# Patient Record
Sex: Male | Born: 1940 | Race: White | Hispanic: No | Marital: Single | Smoking: Never smoker
Health system: Southern US, Community
[De-identification: ages and names within clinical notes are randomized; demographics above are authoritative.]

## PROBLEM LIST (undated history)

## (undated) DIAGNOSIS — R569 Unspecified convulsions: Secondary | ICD-10-CM

## (undated) DIAGNOSIS — I639 Cerebral infarction, unspecified: Secondary | ICD-10-CM

## (undated) DIAGNOSIS — E785 Hyperlipidemia, unspecified: Secondary | ICD-10-CM

## (undated) DIAGNOSIS — I2510 Atherosclerotic heart disease of native coronary artery without angina pectoris: Secondary | ICD-10-CM

## (undated) DIAGNOSIS — R4701 Aphasia: Secondary | ICD-10-CM

## (undated) DIAGNOSIS — G8190 Hemiplegia, unspecified affecting unspecified side: Secondary | ICD-10-CM

## (undated) DIAGNOSIS — K219 Gastro-esophageal reflux disease without esophagitis: Secondary | ICD-10-CM

## (undated) HISTORY — DX: Hyperlipidemia, unspecified: E78.5

## (undated) HISTORY — DX: Atherosclerotic heart disease of native coronary artery without angina pectoris: I25.10

## (undated) HISTORY — DX: Aphasia: R47.01

## (undated) HISTORY — DX: Gastro-esophageal reflux disease without esophagitis: K21.9

## (undated) HISTORY — DX: Cerebral infarction, unspecified: I63.9

## (undated) HISTORY — DX: Hemiplegia, unspecified affecting unspecified side: G81.90

## (undated) HISTORY — DX: Unspecified convulsions: R56.9

---

## 1999-10-24 ENCOUNTER — Ambulatory Visit (HOSPITAL_COMMUNITY): Admission: RE | Admit: 1999-10-24 | Discharge: 1999-10-24 | Payer: Self-pay

## 2000-01-30 ENCOUNTER — Ambulatory Visit (HOSPITAL_COMMUNITY): Admission: RE | Admit: 2000-01-30 | Discharge: 2000-01-30 | Payer: Self-pay | Admitting: Gastroenterology

## 2001-05-14 ENCOUNTER — Encounter: Admission: RE | Admit: 2001-05-14 | Discharge: 2001-05-14 | Payer: Self-pay | Admitting: Urology

## 2001-05-14 ENCOUNTER — Encounter: Payer: Self-pay | Admitting: Urology

## 2001-08-24 ENCOUNTER — Other Ambulatory Visit: Admission: RE | Admit: 2001-08-24 | Discharge: 2001-08-24 | Payer: Self-pay | Admitting: Urology

## 2004-10-01 ENCOUNTER — Ambulatory Visit (HOSPITAL_COMMUNITY): Admission: RE | Admit: 2004-10-01 | Discharge: 2004-10-01 | Payer: Self-pay | Admitting: *Deleted

## 2006-01-22 ENCOUNTER — Emergency Department (HOSPITAL_COMMUNITY): Admission: EM | Admit: 2006-01-22 | Discharge: 2006-01-22 | Payer: Self-pay | Admitting: Emergency Medicine

## 2015-05-09 ENCOUNTER — Other Ambulatory Visit: Payer: Self-pay | Admitting: Gastroenterology

## 2017-10-08 ENCOUNTER — Ambulatory Visit
Admission: RE | Admit: 2017-10-08 | Discharge: 2017-10-08 | Disposition: A | Discharge status: Home / Self Care | Payer: Medicare Other | Source: Ambulatory Visit | Attending: Family Medicine | Admitting: Family Medicine

## 2017-10-08 ENCOUNTER — Other Ambulatory Visit: Payer: Self-pay | Admitting: Family Medicine

## 2017-10-08 DIAGNOSIS — I10 Essential (primary) hypertension: Secondary | ICD-10-CM

## 2019-06-29 ENCOUNTER — Other Ambulatory Visit: Payer: Self-pay

## 2019-06-29 ENCOUNTER — Ambulatory Visit (INDEPENDENT_AMBULATORY_CARE_PROVIDER_SITE_OTHER): Payer: Medicare Other | Admitting: Orthopaedic Surgery

## 2019-06-29 ENCOUNTER — Ambulatory Visit: Payer: Self-pay

## 2019-06-29 VITALS — Ht 70.0 in | Wt 185.0 lb

## 2019-06-29 DIAGNOSIS — M25562 Pain in left knee: Secondary | ICD-10-CM | POA: Diagnosis not present

## 2019-06-29 DIAGNOSIS — G8929 Other chronic pain: Secondary | ICD-10-CM | POA: Diagnosis not present

## 2019-06-29 MED ORDER — BUPIVACAINE HCL 0.5 % IJ SOLN
2.0000 mL | INTRAMUSCULAR | Status: AC | PRN
  Administered 2019-06-29: 2 mL via INTRA_ARTICULAR

## 2019-06-29 MED ORDER — METHYLPREDNISOLONE ACETATE 40 MG/ML IJ SUSP
40.0000 mg | INTRAMUSCULAR | Status: AC | PRN
  Administered 2019-06-29: 15:00:00 40 mg via INTRA_ARTICULAR

## 2019-06-29 MED ORDER — LIDOCAINE HCL 1 % IJ SOLN
2.0000 mL | INTRAMUSCULAR | Status: AC | PRN
  Administered 2019-06-29: 15:00:00 2 mL

## 2019-06-29 NOTE — Addendum Note (Signed)
Addended by: Precious Bard on: 06/29/2019 04:16 PM   Modules accepted: Orders

## 2019-06-29 NOTE — Progress Notes (Signed)
Office Visit Note   Patient: Benjamin Rowland           Date of Birth: Jan 28, 1941           MRN: [id number] Visit Date: 06/29/2019              Requested by: Kaleen Mask, MD [address] [place],  [place] [address] PCP: Kaleen Mask, MD   Assessment & Plan: Visit Diagnoses:  1. 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  Aspiration injection performed today.  Patient tolerated this well.  He is to take it easy over the next couple days and then increase activity as tolerated.  We hope that this will give him better relief than over-the-counter medicines.  Fluid was sent for analysis.  Follow-Up Instructions: Return if symptoms worsen or fail to improve.   Orders:  Orders Placed This Encounter  Procedures  . XR Knee Complete 4 Views Left   No orders of the defined types were placed in this encounter.     Procedures: Large Joint Inj: L knee on 06/29/2019 2:57 PM Details: 22 G needle Medications: 2 mL bupivacaine 0.5 %; 2 mL lidocaine 1 %; 40 mg methylPREDNISolone acetate 40 MG/ML Aspirate: 24 mL yellow; sent for lab analysis Outcome: tolerated well, no immediate complications Patient was prepped and draped in the usual sterile fashion.       Clinical Data: No additional findings.   Subjective: Chief Complaint  Patient presents with  . Left Knee - Pain    [name] is a 78 year old gentleman comes in for evaluation of chronic left knee pain for the last 3 to 6 months.  This knee has been bothering him off and on for quite some time.  He states that recently it has become very painful and he cannot sleep at night.  He is taking a lot of BC powder to help with the pain but it only gives him temporary relief.  The pain is causing him to stumble.  He has had a prior left knee aspiration.  Denies any mechanical symptoms or constitutional symptoms.   Review of  Systems  Constitutional: Negative.   All other systems reviewed and are negative.    Objective: Vital Signs: Ht 5\' 10"  (1.778 m)   Wt 185 lb (83.9 kg)   BMI 26.54 kg/m   Physical Exam Vitals signs and nursing note reviewed.  Constitutional:      Appearance: He is well-developed.  HENT:     Head: Normocephalic and atraumatic.  Eyes:     Pupils: Pupils are equal, round, and reactive to light.  Neck:     Musculoskeletal: Neck supple.  Pulmonary:     Effort: Pulmonary effort is normal.  Abdominal:     Palpations: Abdomen is soft.  Musculoskeletal: Normal range of motion.  Skin:    General: Skin is warm.  Neurological:     Mental Status: He is alert and oriented to person, place, and time.  Psychiatric:        Behavior: Behavior normal.        Thought Content: Thought content normal.        Judgment: Judgment normal.     Ortho Exam Left knee exam shows a moderate joint effusion.  Collaterals and cruciates are stable.  Relatively normal range of motion with mild pain. Specialty Comments:  No specialty comments available.  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  There is widespread DJD as well.    PMFS History: There are no active problems to display for this patient.  No past medical history on file.  No family history on file.   Social History   Occupational History  . Not on file  Tobacco Use  . Smoking status: Not on file  Substance and Sexual Activity  . Alcohol use: Not on file  . Drug use: Not on file  . Sexual activity: Not on file

## 2019-06-30 LAB — SYNOVIAL CELL COUNT + DIFF, W/ CRYSTALS
Basophils, %: 0 %
Eosinophils-Synovial: 0 % (ref 0–2)
Lymphocytes-Synovial Fld: 28 % (ref 0–74)
Monocyte/Macrophage: 60 % (ref 0–69)
Neutrophil, Synovial: 12 % (ref 0–24)
Synoviocytes, %: 0 % (ref 0–15)
WBC, Synovial: 380 cells/uL — ABNORMAL HIGH (ref <150)

## 2019-06-30 LAB — TIQ-NTM

## 2019-06-30 NOTE — Progress Notes (Signed)
Fluid consistent with inflammation.

## 2019-09-20 ENCOUNTER — Telehealth: Payer: Self-pay | Admitting: Orthopaedic Surgery

## 2019-09-20 NOTE — Telephone Encounter (Signed)
06/29/2019 labs faxed to [facility] [phone], Larita Fife

## 2019-09-24 ENCOUNTER — Telehealth (HOSPITAL_COMMUNITY): Payer: Self-pay

## 2019-09-24 NOTE — Telephone Encounter (Signed)

## 2019-09-27 ENCOUNTER — Other Ambulatory Visit (HOSPITAL_COMMUNITY): Payer: Self-pay | Admitting: Family Medicine

## 2019-09-27 ENCOUNTER — Ambulatory Visit (HOSPITAL_COMMUNITY)
Admission: RE | Admit: 2019-09-27 | Discharge: 2019-09-27 | Disposition: A | Discharge status: Home / Self Care | Payer: Medicare Other | Source: Ambulatory Visit | Attending: Surgery | Admitting: Surgery

## 2019-09-27 ENCOUNTER — Other Ambulatory Visit: Payer: Self-pay

## 2019-09-27 DIAGNOSIS — M79606 Pain in leg, unspecified: Secondary | ICD-10-CM | POA: Diagnosis present

## 2019-10-05 ENCOUNTER — Other Ambulatory Visit: Payer: Self-pay

## 2019-10-05 ENCOUNTER — Encounter: Payer: Self-pay | Admitting: Orthopaedic Surgery

## 2019-10-05 ENCOUNTER — Ambulatory Visit (INDEPENDENT_AMBULATORY_CARE_PROVIDER_SITE_OTHER): Payer: Medicare Other | Admitting: Orthopaedic Surgery

## 2019-10-05 DIAGNOSIS — G8929 Other chronic pain: Secondary | ICD-10-CM | POA: Diagnosis not present

## 2019-10-05 DIAGNOSIS — M25562 Pain in left knee: Secondary | ICD-10-CM | POA: Diagnosis not present

## 2019-10-05 MED ORDER — METHYLPREDNISOLONE ACETATE 40 MG/ML IJ SUSP
40.0000 mg | INTRAMUSCULAR | Status: AC | PRN
  Administered 2019-10-05: 09:00:00 40 mg via INTRA_ARTICULAR

## 2019-10-05 MED ORDER — LIDOCAINE HCL 1 % IJ SOLN
2.0000 mL | INTRAMUSCULAR | Status: AC | PRN
  Administered 2019-10-05: 09:00:00 2 mL

## 2019-10-05 MED ORDER — BUPIVACAINE HCL 0.5 % IJ SOLN
2.0000 mL | INTRAMUSCULAR | Status: AC | PRN
  Administered 2019-10-05: 09:00:00 2 mL via INTRA_ARTICULAR

## 2019-10-05 NOTE — Progress Notes (Signed)
Office Visit Note   Patient: Benjamin Rowland           Date of Birth: 1941-01-31           MRN: [id number] Visit Date: 10/05/2019              Requested by: Kaleen Mask, MD [address] [place],  [place] [address] PCP: Kaleen Mask, MD   Assessment & Plan: Visit Diagnoses:  1. Chronic pain of left knee     Plan: Impression is recurrent left knee effusion.  I reviewed his x-rays again which shows chondrocalcinosis and the medial femoral condyle OCD defect.  Aspiration injection performed today.  10 cc obtained.   Knee compression sleeve was provided.  We will need to obtain an MRI of the left knee at this point to evaluate for structural abnormalities.  Follow-up after the MRI.  Follow-Up Instructions: Return in about 10 days (around 10/15/2019).   Orders:  Orders Placed This Encounter  Procedures  . Large Joint Inj   No orders of the defined types were placed in this encounter.     Procedures: Large Joint Inj: L knee on 10/05/2019 8:38 AM Details: 22 G needle Medications: 2 mL bupivacaine 0.5 %; 2 mL lidocaine 1 %; 40 mg methylPREDNISolone acetate 40 MG/ML Outcome: tolerated well, no immediate complications Patient was prepped and draped in the usual sterile fashion.       Clinical Data: No additional findings.   Subjective: Chief Complaint  Patient presents with  . Left Knee - Pain    Zaid is following up today for recurrent left knee pain and effusion.  We saw him about 3 months ago and took off about 24 cc of joint fluid and provide him with a cortisone injection.  He states that the cortisone injection did not really help and the fluid has returned.  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  He takes BC powder and Aleve.   Review of Systems  Constitutional: Negative.   All other systems reviewed and are negative.    Objective: Vital Signs: There were no vitals taken for this visit.  Physical Exam Vitals and nursing note  reviewed.  Constitutional:      Appearance: He is well-developed.  Pulmonary:     Effort: Pulmonary effort is normal.  Abdominal:     Palpations: Abdomen is soft.  Skin:    General: Skin is warm.  Neurological:     Mental Status: He is alert and oriented to person, place, and time.  Psychiatric:        Behavior: Behavior normal.        Thought Content: Thought content normal.        Judgment: Judgment normal.     Ortho Exam Left knee exam shows recurrence of the effusion.  No signs of infection or warmth or redness. Specialty Comments:  No specialty comments available.  Imaging: No results found.   PMFS History: Patient Active Problem List   Diagnosis Date Noted  . Chronic pain of left knee 10/05/2019   History reviewed. No pertinent past medical history.  History reviewed. No pertinent family history.  History reviewed. No pertinent surgical history. Social History   Occupational History  . Not on file  Tobacco Use  . Smoking status: Never Smoker  . Smokeless tobacco: Never Used  Substance and Sexual Activity  . Alcohol use: Not on file  . Drug use: Not on file  . Sexual  activity: Not on file

## 2019-10-06 ENCOUNTER — Other Ambulatory Visit: Payer: Self-pay | Admitting: Family Medicine

## 2019-10-06 DIAGNOSIS — M79606 Pain in leg, unspecified: Secondary | ICD-10-CM

## 2019-10-15 ENCOUNTER — Ambulatory Visit (INDEPENDENT_AMBULATORY_CARE_PROVIDER_SITE_OTHER): Payer: Medicare Other | Admitting: Orthopaedic Surgery

## 2019-10-15 ENCOUNTER — Other Ambulatory Visit: Payer: Self-pay

## 2019-10-15 DIAGNOSIS — M25562 Pain in left knee: Secondary | ICD-10-CM

## 2019-10-15 DIAGNOSIS — G8929 Other chronic pain: Secondary | ICD-10-CM

## 2019-10-15 NOTE — Progress Notes (Signed)
Rescheduled until after MRI

## 2019-10-20 ENCOUNTER — Other Ambulatory Visit: Payer: Self-pay

## 2019-10-20 ENCOUNTER — Ambulatory Visit
Admission: RE | Admit: 2019-10-20 | Discharge: 2019-10-20 | Disposition: A | Discharge status: Home / Self Care | Payer: Medicare Other | Source: Ambulatory Visit | Attending: Orthopaedic Surgery | Admitting: Orthopaedic Surgery

## 2019-10-20 DIAGNOSIS — G8929 Other chronic pain: Secondary | ICD-10-CM

## 2019-10-22 NOTE — Progress Notes (Signed)
Needs f/u appt 

## 2019-10-28 ENCOUNTER — Other Ambulatory Visit: Payer: Self-pay

## 2019-10-28 ENCOUNTER — Ambulatory Visit (INDEPENDENT_AMBULATORY_CARE_PROVIDER_SITE_OTHER): Payer: Medicare Other | Admitting: Orthopaedic Surgery

## 2019-10-28 ENCOUNTER — Encounter: Payer: Self-pay | Admitting: Orthopaedic Surgery

## 2019-10-28 VITALS — Ht 69.0 in

## 2019-10-28 DIAGNOSIS — M1712 Unilateral primary osteoarthritis, left knee: Secondary | ICD-10-CM | POA: Diagnosis not present

## 2019-10-28 MED ORDER — MELOXICAM 7.5 MG PO TABS: 7.5000 mg | ORAL_TABLET | Freq: Two times a day (BID) | ORAL | 2 refills | Status: DC | PRN

## 2019-10-28 NOTE — Progress Notes (Signed)
   Office Visit Note   Patient: Benjamin Rowland           Date of Birth: [birth year]           MRN: [id number] Visit Date: 10/28/2019              Requested by: Kaleen Mask, MD [address] [place],  [place] [address] PCP: Kaleen Mask, MD   Assessment & Plan: Visit Diagnoses:  1. Primary osteoarthritis of left knee     Plan: My right knee MRI is consistent with advanced tricompartmental degenerative joint disease.  These findings were reviewed with the patient in detail.  We had a long discussion today about treatment options including knee replacement.  He will take some time to think about this.  In the meantime he will take meloxicam as needed but he will check with his PCP first his creatinine did increase last time he took meloxicam.  Follow-Up Instructions: Return if symptoms worsen or fail to improve.   Orders:  No orders of the defined types were placed in this encounter.  Meds ordered this encounter  Medications  . meloxicam (MOBIC) 7.5 MG tablet    Sig: Take 1 tablet (7.5 mg total) by mouth 2 (two) times daily as needed for pain.    Dispense:  30 tablet    Refill:  2      Procedures: No procedures performed   Clinical Data: No additional findings.   Subjective: Chief Complaint  Patient presents with  . Left Knee - Follow-up    MRI results    Benjamin Rowland returns today for MRI review of the left knee.  He still has significant pain throughout the day and severe difficulty with ADLs.  Cortisone injections, over-the-counter NSAIDs, Voltaren gel have not provided much relief.  He ambulates with a cane occasionally.   Review of Systems  Constitutional: Negative.   All other systems reviewed and are negative.    Objective: Vital Signs: Ht 5\' 9"  (1.753 m)   BMI 27.32 kg/m   Physical Exam Vitals and nursing note reviewed.  Constitutional:      Appearance: He is well-developed.  Pulmonary:     Effort: Pulmonary effort is normal.    Abdominal:     Palpations: Abdomen is soft.  Skin:    General: Skin is warm.  Neurological:     Mental Status: He is alert and oriented to person, place, and time.  Psychiatric:        Behavior: Behavior normal.        Thought Content: Thought content normal.        Judgment: Judgment normal.     Ortho Exam Left knee exam is unchanged. Specialty Comments:  No specialty comments available.  Imaging: No results found.   PMFS History: Patient Active Problem List   Diagnosis Date Noted  . Primary osteoarthritis of left knee 10/28/2019  . Chronic pain of left knee 10/05/2019   History reviewed. No pertinent past medical history.  History reviewed. No pertinent family history.  History reviewed. No pertinent surgical history. Social History   Occupational History  . Not on file  Tobacco Use  . Smoking status: Never Smoker  . Smokeless tobacco: Never Used  Substance and Sexual Activity  . Alcohol use: Not on file  . Drug use: Not on file  . Sexual activity: Not on file

## 2020-01-10 ENCOUNTER — Other Ambulatory Visit: Payer: Self-pay | Admitting: Family Medicine

## 2020-01-10 ENCOUNTER — Ambulatory Visit
Admission: RE | Admit: 2020-01-10 | Discharge: 2020-01-10 | Disposition: A | Discharge status: Home / Self Care | Payer: Medicare Other | Source: Ambulatory Visit | Attending: Family Medicine | Admitting: Family Medicine

## 2020-01-10 DIAGNOSIS — M25551 Pain in right hip: Secondary | ICD-10-CM

## 2020-01-10 DIAGNOSIS — M25552 Pain in left hip: Secondary | ICD-10-CM

## 2020-01-20 ENCOUNTER — Other Ambulatory Visit: Payer: Self-pay | Admitting: Family Medicine

## 2020-01-20 DIAGNOSIS — K5909 Other constipation: Secondary | ICD-10-CM

## 2020-01-20 DIAGNOSIS — M25559 Pain in unspecified hip: Secondary | ICD-10-CM

## 2020-01-24 ENCOUNTER — Ambulatory Visit
Admission: RE | Admit: 2020-01-24 | Discharge: 2020-01-24 | Disposition: A | Discharge status: Home / Self Care | Payer: Medicare Other | Source: Ambulatory Visit | Attending: Family Medicine | Admitting: Family Medicine

## 2020-01-24 DIAGNOSIS — K5909 Other constipation: Secondary | ICD-10-CM

## 2020-01-24 DIAGNOSIS — M25559 Pain in unspecified hip: Secondary | ICD-10-CM

## 2020-01-24 MED ORDER — IOPAMIDOL (ISOVUE-300) INJECTION 61%
100.0000 mL | Freq: Once | INTRAVENOUS | Status: AC | PRN
  Administered 2020-01-24: 09:00:00 100 mL via INTRAVENOUS

## 2021-03-05 ENCOUNTER — Emergency Department (HOSPITAL_COMMUNITY): Payer: Medicare Other

## 2021-03-05 ENCOUNTER — Inpatient Hospital Stay (HOSPITAL_COMMUNITY)
Admission: EM | Admit: 2021-03-05 | Discharge: 2021-03-08 | DRG: 040 | Disposition: A | Discharge status: Skilled Nursing Facility | Payer: Medicare Other | Source: Other Acute Inpatient Hospital | Attending: Neurology | Admitting: Neurology

## 2021-03-05 ENCOUNTER — Inpatient Hospital Stay (HOSPITAL_COMMUNITY): Payer: Medicare Other

## 2021-03-05 DIAGNOSIS — I480 Paroxysmal atrial fibrillation: Secondary | ICD-10-CM | POA: Diagnosis present

## 2021-03-05 DIAGNOSIS — I63512 Cerebral infarction due to unspecified occlusion or stenosis of left middle cerebral artery: Secondary | ICD-10-CM | POA: Diagnosis present

## 2021-03-05 DIAGNOSIS — I10 Essential (primary) hypertension: Secondary | ICD-10-CM | POA: Diagnosis present

## 2021-03-05 DIAGNOSIS — E785 Hyperlipidemia, unspecified: Secondary | ICD-10-CM | POA: Diagnosis present

## 2021-03-05 DIAGNOSIS — I69351 Hemiplegia and hemiparesis following cerebral infarction affecting right dominant side: Secondary | ICD-10-CM

## 2021-03-05 DIAGNOSIS — G8929 Other chronic pain: Secondary | ICD-10-CM | POA: Diagnosis present

## 2021-03-05 DIAGNOSIS — R29707 NIHSS score 7: Secondary | ICD-10-CM | POA: Diagnosis present

## 2021-03-05 DIAGNOSIS — Z981 Arthrodesis status: Secondary | ICD-10-CM

## 2021-03-05 DIAGNOSIS — Z20822 Contact with and (suspected) exposure to covid-19: Secondary | ICD-10-CM | POA: Diagnosis present

## 2021-03-05 DIAGNOSIS — R001 Bradycardia, unspecified: Secondary | ICD-10-CM | POA: Diagnosis not present

## 2021-03-05 DIAGNOSIS — R471 Dysarthria and anarthria: Secondary | ICD-10-CM | POA: Diagnosis present

## 2021-03-05 DIAGNOSIS — I6300 Cerebral infarction due to thrombosis of unspecified precerebral artery: Secondary | ICD-10-CM | POA: Diagnosis not present

## 2021-03-05 DIAGNOSIS — I639 Cerebral infarction, unspecified: Secondary | ICD-10-CM

## 2021-03-05 DIAGNOSIS — R4701 Aphasia: Secondary | ICD-10-CM | POA: Diagnosis present

## 2021-03-05 DIAGNOSIS — M1712 Unilateral primary osteoarthritis, left knee: Secondary | ICD-10-CM | POA: Diagnosis present

## 2021-03-05 DIAGNOSIS — R29810 Facial weakness: Secondary | ICD-10-CM | POA: Diagnosis present

## 2021-03-05 DIAGNOSIS — M47812 Spondylosis without myelopathy or radiculopathy, cervical region: Secondary | ICD-10-CM | POA: Diagnosis present

## 2021-03-05 DIAGNOSIS — G936 Cerebral edema: Secondary | ICD-10-CM | POA: Diagnosis present

## 2021-03-05 DIAGNOSIS — I6389 Other cerebral infarction: Secondary | ICD-10-CM | POA: Diagnosis not present

## 2021-03-05 DIAGNOSIS — R270 Ataxia, unspecified: Secondary | ICD-10-CM | POA: Diagnosis present

## 2021-03-05 DIAGNOSIS — I672 Cerebral atherosclerosis: Secondary | ICD-10-CM | POA: Diagnosis present

## 2021-03-05 DIAGNOSIS — G9389 Other specified disorders of brain: Secondary | ICD-10-CM | POA: Diagnosis not present

## 2021-03-05 LAB — URINALYSIS, ROUTINE W REFLEX MICROSCOPIC
Bilirubin Urine: NEGATIVE
Glucose, UA: NEGATIVE mg/dL
Hgb urine dipstick: NEGATIVE
Ketones, ur: NEGATIVE mg/dL
Leukocytes,Ua: NEGATIVE
Nitrite: NEGATIVE
Protein, ur: NEGATIVE mg/dL
Specific Gravity, Urine: 1.021 (ref 1.005–1.030)
pH: 6 (ref 5.0–8.0)

## 2021-03-05 LAB — DIFFERENTIAL
Abs Immature Granulocytes: 0.02 10*3/uL (ref 0.00–0.07)
Basophils Absolute: 0 10*3/uL (ref 0.0–0.1)
Basophils Relative: 1 %
Eosinophils Absolute: 0.2 10*3/uL (ref 0.0–0.5)
Eosinophils Relative: 3 %
Immature Granulocytes: 0 %
Lymphocytes Relative: 34 %
Lymphs Abs: 2.6 10*3/uL (ref 0.7–4.0)
Monocytes Absolute: 0.8 10*3/uL (ref 0.1–1.0)
Monocytes Relative: 11 %
Neutro Abs: 4 10*3/uL (ref 1.7–7.7)
Neutrophils Relative %: 51 %

## 2021-03-05 LAB — CBC
HCT: 47 % (ref 39.0–52.0)
Hemoglobin: 15.9 g/dL (ref 13.0–17.0)
MCH: 31.2 pg (ref 26.0–34.0)
MCHC: 33.8 g/dL (ref 30.0–36.0)
MCV: 92.3 fL (ref 80.0–100.0)
Platelets: 246 10*3/uL (ref 150–400)
RBC: 5.09 MIL/uL (ref 4.22–5.81)
RDW: 13.7 % (ref 11.5–15.5)
WBC: 7.6 10*3/uL (ref 4.0–10.5)
nRBC: 0 % (ref 0.0–0.2)

## 2021-03-05 LAB — COMPREHENSIVE METABOLIC PANEL
ALT: 17 U/L (ref 0–44)
AST: 29 U/L (ref 15–41)
Albumin: 3.8 g/dL (ref 3.5–5.0)
Alkaline Phosphatase: 82 U/L (ref 38–126)
Anion gap: 7 (ref 5–15)
BUN: 18 mg/dL (ref 8–23)
CO2: 25 mmol/L (ref 22–32)
Calcium: 9.3 mg/dL (ref 8.9–10.3)
Chloride: 106 mmol/L (ref 98–111)
Creatinine, Ser: 1.3 mg/dL — ABNORMAL HIGH (ref 0.61–1.24)
GFR, Estimated: 56 mL/min — ABNORMAL LOW (ref >60)
Glucose, Bld: 117 mg/dL — ABNORMAL HIGH (ref 70–99)
Potassium: 3.9 mmol/L (ref 3.5–5.1)
Sodium: 138 mmol/L (ref 135–145)
Total Bilirubin: 0.7 mg/dL (ref 0.3–1.2)
Total Protein: 6.5 g/dL (ref 6.5–8.1)

## 2021-03-05 LAB — I-STAT CHEM 8, ED
BUN: 24 mg/dL — ABNORMAL HIGH (ref 8–23)
Calcium, Ion: 1.09 mmol/L — ABNORMAL LOW (ref 1.15–1.40)
Chloride: 104 mmol/L (ref 98–111)
Creatinine, Ser: 1.3 mg/dL — ABNORMAL HIGH (ref 0.61–1.24)
Glucose, Bld: 113 mg/dL — ABNORMAL HIGH (ref 70–99)
HCT: 44 % (ref 39.0–52.0)
Hemoglobin: 15 g/dL (ref 13.0–17.0)
Potassium: 3.9 mmol/L (ref 3.5–5.1)
Sodium: 139 mmol/L (ref 135–145)
TCO2: 25 mmol/L (ref 22–32)

## 2021-03-05 LAB — ETHANOL: Alcohol, Ethyl (B): <10 mg/dL (ref <10)

## 2021-03-05 LAB — PROTIME-INR
INR: 1 (ref 0.8–1.2)
Prothrombin Time: 13.6 seconds (ref 11.4–15.2)

## 2021-03-05 LAB — RAPID URINE DRUG SCREEN, HOSP PERFORMED
Amphetamines: NOT DETECTED
Barbiturates: NOT DETECTED
Benzodiazepines: NOT DETECTED
Cocaine: NOT DETECTED
Opiates: NOT DETECTED
Tetrahydrocannabinol: NOT DETECTED

## 2021-03-05 LAB — CBG MONITORING, ED: Glucose-Capillary: 117 mg/dL — ABNORMAL HIGH (ref 70–99)

## 2021-03-05 LAB — RESP PANEL BY RT-PCR (FLU A&B, COVID) ARPGX2
Influenza A by PCR: NEGATIVE
Influenza B by PCR: NEGATIVE
SARS Coronavirus 2 by RT PCR: NEGATIVE

## 2021-03-05 LAB — APTT: aPTT: 31 seconds (ref 24–36)

## 2021-03-05 MED ORDER — ALTEPLASE (STROKE) FULL DOSE INFUSION
0.9000 mg/kg | Freq: Once | INTRAVENOUS | Status: DC
  Filled 2021-03-05: qty 100

## 2021-03-05 MED ORDER — ACETAMINOPHEN 325 MG PO TABS
650.0000 mg | ORAL_TABLET | ORAL | Status: DC | PRN
  Administered 2021-03-06: 650 mg via ORAL
  Filled 2021-03-05: qty 2

## 2021-03-05 MED ORDER — SENNOSIDES-DOCUSATE SODIUM 8.6-50 MG PO TABS
1.0000 | ORAL_TABLET | Freq: Every evening | ORAL | Status: DC | PRN
  Administered 2021-03-08: 1 via ORAL
  Filled 2021-03-05: qty 1

## 2021-03-05 MED ORDER — IOHEXOL 350 MG/ML SOLN
40.0000 mL | Freq: Once | INTRAVENOUS | Status: AC | PRN
  Administered 2021-03-05: 40 mL via INTRAVENOUS

## 2021-03-05 MED ORDER — IOHEXOL 350 MG/ML SOLN
75.0000 mL | Freq: Once | INTRAVENOUS | Status: AC | PRN
  Administered 2021-03-05: 75 mL via INTRAVENOUS

## 2021-03-05 MED ORDER — CLEVIDIPINE BUTYRATE 0.5 MG/ML IV EMUL
0.0000 mg/h | INTRAVENOUS | Status: DC
  Filled 2021-03-05: qty 50

## 2021-03-05 MED ORDER — CLEVIDIPINE BUTYRATE 0.5 MG/ML IV EMUL
INTRAVENOUS | Status: AC
  Administered 2021-03-05: 1 mg/h via INTRAVENOUS
  Filled 2021-03-05: qty 50

## 2021-03-05 MED ORDER — SODIUM CHLORIDE 0.9 % IV SOLN
250.0000 mg | Freq: Once | INTRAVENOUS | Status: AC
  Administered 2021-03-05: 250 mg via INTRAVENOUS
  Filled 2021-03-05: qty 2

## 2021-03-05 MED ORDER — STROKE: EARLY STAGES OF RECOVERY BOOK
Freq: Once | Status: AC
  Filled 2021-03-05 (×2): qty 1

## 2021-03-05 MED ORDER — DIPHENHYDRAMINE HCL 50 MG/ML IJ SOLN
25.0000 mg | Freq: Once | INTRAMUSCULAR | Status: AC
  Administered 2021-03-05: 25 mg via INTRAVENOUS
  Filled 2021-03-05: qty 1

## 2021-03-05 MED ORDER — SODIUM CHLORIDE 0.9 % IV SOLN: INTRAVENOUS | Status: AC

## 2021-03-05 MED ORDER — HYDRALAZINE HCL 20 MG/ML IJ SOLN: 20.0000 mg | Freq: Once | INTRAMUSCULAR | Status: AC

## 2021-03-05 MED ORDER — SODIUM CHLORIDE 0.9 % IV SOLN: 50.0000 mL | Freq: Once | INTRAVENOUS | Status: DC

## 2021-03-05 MED ORDER — HYDRALAZINE HCL 20 MG/ML IJ SOLN
INTRAMUSCULAR | Status: AC
  Administered 2021-03-05: 20 mg via INTRAVENOUS
  Filled 2021-03-05: qty 1

## 2021-03-05 MED ORDER — ACETAMINOPHEN 160 MG/5ML PO SOLN: 650.0000 mg | ORAL | Status: DC | PRN

## 2021-03-05 MED ORDER — PANTOPRAZOLE SODIUM 40 MG IV SOLR
40.0000 mg | Freq: Every day | INTRAVENOUS | Status: DC
  Administered 2021-03-05 – 2021-03-06 (×2): 40 mg via INTRAVENOUS
  Filled 2021-03-05 (×2): qty 40

## 2021-03-05 MED ORDER — CHLORHEXIDINE GLUCONATE CLOTH 2 % EX PADS
6.0000 | MEDICATED_PAD | Freq: Every day | CUTANEOUS | Status: DC
  Administered 2021-03-08: 6 via TOPICAL

## 2021-03-05 MED ORDER — CLEVIDIPINE BUTYRATE 0.5 MG/ML IV EMUL
0.0000 mg/h | INTRAVENOUS | Status: DC
  Administered 2021-03-05: 1 mg/h via INTRAVENOUS
  Filled 2021-03-05: qty 50

## 2021-03-05 MED ORDER — ORAL CARE MOUTH RINSE
15.0000 mL | Freq: Two times a day (BID) | OROMUCOSAL | Status: DC
  Administered 2021-03-05 – 2021-03-08 (×6): 15 mL via OROMUCOSAL

## 2021-03-05 MED ORDER — FAMOTIDINE IN NACL 20-0.9 MG/50ML-% IV SOLN
20.0000 mg | Freq: Once | INTRAVENOUS | Status: AC
  Administered 2021-03-05: 20 mg via INTRAVENOUS
  Filled 2021-03-05: qty 50

## 2021-03-05 MED ORDER — ALTEPLASE (STROKE) FULL DOSE INFUSION
0.9000 mg/kg | Freq: Once | INTRAVENOUS | Status: DC
  Administered 2021-03-05: 79.4 mg via INTRAVENOUS
  Filled 2021-03-05: qty 100

## 2021-03-05 MED ORDER — ACETAMINOPHEN 650 MG RE SUPP: 650.0000 mg | RECTAL | Status: DC | PRN

## 2021-03-05 NOTE — Progress Notes (Signed)
Pharmacist Code Stroke Response  Notified to mix tPA at 1205 by Dr. Derry Lory Delivered tPA to RN at 1220  tPA dose = 7.9 mg bolus over 1 minute followed by 71.5mg  for a total dose of 79.4 mg over 1 hour  Issues/delays encountered (if applicable): None  Tera Mater 03/05/21 12:37 PM

## 2021-03-05 NOTE — ED Provider Notes (Signed)
[facility] EMERGENCY DEPARTMENT Provider Note   CSN: [id number] Arrival date & time: 03/05/21  1200     History No chief complaint on file.   Benjamin Rowland is a 80 y.o. male.  HPI Last seen normal 10 AM.  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  He was aware of a sudden onset of right-sided weakness and a foggy feeling in his head.  By the time EMS arrived, patient's symptoms had resolved.  During transport, symptoms rebounded prior to arrival.  Patient's first call had been for right arm weakness and leg numbness.  Patient reports this morning he was completely normal.  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  Now, he reports he has a foggy feeling in his head.  He reports his right arm is weak.  Never similar symptoms.  Patient is not on any blood thinner medications.  No trauma.    No past medical history on file.  Patient Active Problem List   Diagnosis Date Noted   Primary osteoarthritis of left knee 10/28/2019   Chronic pain of left knee 10/05/2019    No past surgical history on file.     No family history on file.  Social History   Tobacco Use   Smoking status: Never   Smokeless tobacco: Never    Home Medications Prior to Admission medications   Medication Sig Start Date End Date Taking? Authorizing Provider  meloxicam (MOBIC) 7.5 MG tablet Take 1 tablet (7.5 mg total) by mouth 2 (two) times daily as needed for pain. 10/28/19   Tarry Kos, MD    Allergies    Patient has no known allergies.  Review of Systems   Review of Systems 10 systems reviewed and negative except as per HPI Physical Exam Updated Vital Signs There were no vitals taken for this visit.  Physical Exam Constitutional:      Comments: Awake and alert.  Patient seems slightly drowsy but is following commands.  HENT:     Head: Normocephalic and atraumatic.     Nose: Nose normal.     Mouth/Throat:     Pharynx: Oropharynx is clear.   Eyes:     Extraocular Movements: Extraocular movements intact.     Pupils: Pupils are equal, round, and reactive to light.  Cardiovascular:     Rate and Rhythm: Normal rate and regular rhythm.  Pulmonary:     Effort: Pulmonary effort is normal.     Breath sounds: Normal breath sounds.  Abdominal:     General: There is no distension.     Palpations: Abdomen is soft.     Tenderness: There is no abdominal tenderness. There is no guarding.  Musculoskeletal:        General: No swelling or tenderness.     Cervical back: Neck supple.  Skin:    General: Skin is warm and dry.  Neurological:     Comments: Patient is just mildly somnolent.  He is following commands.  Patient has left pronator drift.  Grip strength on the left is 2\5, grip strength right 5\5.  Patient cannot perform finger-nose on the right.  He tries to raise his hand to his face but cannot touch his nose.  Normal finger-nose on the left.  Patient can elevate his right lower extremity off of the bed and hold with some weakness present to downward pressure.  Patient can hold the left lower extremity without difficulty and resist downward pressure.  Patient can accurately identify light touch left and right.  He is not extinguishing.  And can identify normal objects correctly.  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    ED Results / Procedures / Treatments   Labs (all labs ordered are listed, but only abnormal results are displayed) Labs Reviewed  CBG MONITORING, ED - Abnormal; Notable for the following components:      Result Value   Glucose-Capillary 117 (*)    All other components within normal limits  RESP PANEL BY RT-PCR (FLU A&B, COVID) ARPGX2  ETHANOL  PROTIME-INR  APTT  CBC  DIFFERENTIAL  COMPREHENSIVE METABOLIC PANEL  RAPID URINE DRUG SCREEN, HOSP PERFORMED  URINALYSIS, ROUTINE W REFLEX MICROSCOPIC  I-STAT CHEM 8, ED    EKG EKG  Interpretation  Date/Time:  Monday March 05 2021 12:43:03 EDT Ventricular Rate:  98 PR Interval:  233 QRS Duration: 136 QT Interval:  363 QTC Calculation: 464 R Axis:   -72 Text Interpretation: Sinus rhythm Atrial premature complex Prolonged PR interval Left bundle branch block oldLBBB, rate increased otherwise no change Confirmed by Arby Barrette [phone]) on 03/05/2021 12:51:07 PM Also confirmed by Arby Barrette [id number]), editor [place], LaVerne ([id number])  on 03/06/2021 7:51:58 AM  Radiology No results found.  Procedures Procedures  CRITICAL CARE Performed by: Arby Barrette   Total critical care time: 30 minutes  Critical care time was exclusive of separately billable procedures and treating other patients.  Critical care was necessary to treat or prevent imminent or life-threatening deterioration.  Critical care was time spent personally by me on the following activities: development of treatment plan with patient and/or surrogate as well as nursing, discussions with consultants, evaluation of patient's response to treatment, examination of patient, obtaining history from patient or surrogate, ordering and performing treatments and interventions, ordering and review of laboratory studies, ordering and review of radiographic studies, pulse oximetry and re-evaluation of patient's condition.  Medications Ordered in ED Medications - No data to display  ED Course  I have reviewed the triage vital signs and the nursing notes.  Pertinent labs & imaging results that were available during my care of the patient were reviewed by me and considered in my medical decision making (see chart for details).  Clinical Course as of 03/11/21 1223  Mon Mar 05, 2021  1356 Patient reassessed.  He has developed facial flushing since getting medications.  He is now getting multiple medications including tPA, hydralazine, Cleviprex.  On exam he does have flushing of the face and the upper chest just in  the neckline of his shirt.  Does not have whole body flushing there is no flushing over that trunk or extremities.  Patient's airway is widely patent.  The posterior airway easily visualized.  There is no wheezing on physical exam.  Patient does not have respiratory distress.  He does perceive his nose to feel congested.  He denies difficulty swallowing.  At this time, I agree with initiating Solu-Medrol, Benadryl for possible allergic reaction.  No signs of large systemic or anaphylactic reaction at this time.  Patient's neurologic symptoms are showing some improvement.  Patient now has more use of the right upper extremity.  He can spontaneously move his hand better than previously.  His speech is slightly improved as well. [MP]    Clinical Course User Index [MP] Arby Barrette, MD   MDM Rules/Calculators/A&P  Patient arrives with findings consistent with acute CVA.  Code stroke activated with last known normal 10 AM.  No stroke history and not on anticoagulants.  Neurology has initiated tPA.  Patient is tolerating tPA with some improvement.  He however did appear to have possible allergic reaction.  This was confined to flushing of the face and the upper chest.  No wheezing or signs of anaphylaxis.  Agree with plan of management. Final Clinical Impression(s) / ED Diagnoses Final diagnoses:  Acute ischemic stroke [facility])    Rx / DC Orders ED Discharge Orders     None        Arby Barrette, MD 03/11/21 1225

## 2021-03-05 NOTE — H&P (Signed)
NEUROLOGY CONSULTATION NOTE   Date of service: March 05, 2021 Patient Name: Benjamin Rowland MRN:  [id number] DOB:  [birth year] Reason for consult: "R sided weakness, stroke code" Requesting Provider: Arby Barrette, MD _ _ _   _ __   _ __ _ _  __ __   _ __   __ _  History of Present Illness  Benjamin Rowland is a 80 y.o. 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 He was brought in to the ED and a stroke code was activated in the ED.  CT Head w/o contrast with L frontal encephalomalacia with some calcification but no ICH.  mRS: 0 tPA: yes, offered. Decision to offer tPA at 1205. SBP was elevated and brought down with hydralazine 20mg  IV once before tPA was started. Thrombectomy: No LVO. NIHSS components Score: Comment  1a Level of Conscious 0[x]  1[]  2[]  3[]      1b LOC Questions 0[x]  1[]  2[]       1c LOC Commands 0[x]  1[]  2[]       2 Best Gaze 0[x]  1[]  2[]       3 Visual 0[x]  1[]  2[]  3[]      4 Facial Palsy 0[]  1[x]  2[]  3[]    Minor drooping of R angle of mouth  5a Motor Arm - left 0[x]  1[]  2[]  3[]  4[]  UN[]    5b Motor Arm - Right 0[]  1[x]  2[]  3[]  4[]  UN[]    6a Motor Leg - Left 0[x]  1[]  2[]  3[]  4[]  UN[]    6b Motor Leg - Right 0[]  1[x]  2[]  3[]  4[]  UN[]    7 Limb Ataxia 0[]  1[]  2[x]  3[]  UN[]     8 Sensory 0[]  1[]  2[x]  UN[]      9 Best Language 0[x]  1[]  2[]  3[]      10 Dysarthria 0[x]  1[]  2[]  UN[]      11 Extinct. and Inattention 0[x]  1[]  2[]       TOTAL: 7       ROS   Constitutional Denies weight loss, fever and chills.   HEENT Denies changes in vision, he is hard of hearing.   Respiratory Denies SOB and cough.   CV Denies palpitations and CP   GI Denies abdominal pain, nausea, vomiting and diarrhea.   GU Denies dysuria and urinary frequency.   MSK Denies  myalgia and joint pain.   Skin Denies rash and pruritus.   Neurological Denies headache and syncope.   Psychiatric Denies recent changes in mood. Denies anxiety and depression.    Past History  No past medical history on file. No past surgical history on file. No family history on file. Social History   Socioeconomic History   Marital status: Single    Spouse name: Not on file   Number of children: Not on file   Years of education: Not on file   Highest education level: Not on file  Occupational History   Not on file  Tobacco Use   Smoking status: Never   Smokeless tobacco: Never  Substance and Sexual Activity   Alcohol use: Not on file   Drug use: Not on file   Sexual activity: Not on file  Other Topics Concern   Not on file  Social History Narrative   Not on file   Social Determinants of Health   Financial  Resource Strain: Not on file  Food Insecurity: Not on file  Transportation Needs: Not on file  Physical Activity: Not on file  Stress: Not on file  Social Connections: Not on file   No Known Allergies  Medications  (Not in a hospital admission)    Vitals   There were no vitals filed for this visit.   There is no height or weight on file to calculate BMI.  Physical Exam   General: Laying comfortably in bed; in no acute distress.  HENT: Normal oropharynx and mucosa. Normal external appearance of ears and nose.  Neck: Supple, no pain or tenderness  CV: No JVD. No peripheral edema.  Pulmonary: Symmetric Chest rise. Normal respiratory effort.  Abdomen: Soft to touch, non-tender.  Ext: No cyanosis, edema, or deformity  Skin: No rash. Normal palpation of skin.   Musculoskeletal: Normal digits and nails by inspection. No clubbing.   Neurologic Examination  Mental status/Cognition: Alert, oriented to self, place, month and year, good attention.  Speech/language: Fluent, comprehension intact, object naming intact, repetition intact.  Cranial nerves:   CN  II Pupils equal and reactive to light, no VF deficits    CN III,IV,VI EOM intact, no gaze preference or deviation, no nystagmus    CN V normal sensation in V1, V2, and V3 segments bilaterally    CN VII Minor R nasolabial fold flattening    CN VIII normal hearing to speech   CN IX & X normal palatal elevation, no uvular deviation   CN XI 5/5 head turn and 5/5 shoulder shrug bilaterally    CN XII midline tongue protrusion   Motor:  Muscle bulk: normal, tone normal Mvmt Root Nerve  Muscle Right Left Comments  SA C5/6 Ax Deltoid 4 5   EF C5/6 Mc Biceps 4 5   EE C6/7/8 Rad Triceps 4 5   WF C6/7 Med FCR     WE C7/8 PIN ECU     F Ab C8/T1 U ADM/FDI 3 5   HF L1/2/3 Fem Illopsoas 4 5   KE L2/3/4 Fem Quad 5 5   DF L4/5 D Peron Tib Ant 5 5   PF S1/2 Tibial Grc/Sol 5 5    Reflexes:  Right Left Comments  Pectoralis      Biceps (C5/6) 2 2   Brachioradialis (C5/6) 2 2    Triceps (C6/7) 1 1    Patellar (L3/4) 1 1    Achilles (S1)      Hoffman      Plantar     Jaw jerk    Sensation:  Light touch Decreased in R arm, absent in R lower leg   Pin prick    Temperature    Vibration   Proprioception    Coordination/Complex Motor:  - Finger to Nose with ataxia in RUE - Heel to shin BL lower ext ataxia. - Rapid alternating movement are slowed. - Gait: not safe to assess given R sided weakness.  Labs   CBC:  Recent Labs  Lab 03/05/21 1200 03/05/21 1206  WBC 7.6  --   NEUTROABS 4.0  --   HGB 15.9 15.0  HCT 47.0 44.0  MCV 92.3  --   PLT 246  --     Basic Metabolic Panel:  Lab Results  Component Value Date   NA 139 03/05/2021   K 3.9 03/05/2021   GLUCOSE 113 (H) 03/05/2021   BUN 24 (H) 03/05/2021   CREATININE 1.30 (H) 03/05/2021   Lipid Panel: No  results found for: LDLCALC HgbA1c: No results found for: HGBA1C Urine Drug Screen: No results found for: LABOPIA, COCAINSCRNUR, LABBENZ, AMPHETMU, THCU, LABBARB  Alcohol Level No results found for: ETH  CT Head without  contrast: CTH was negative for a large hypodensity concerning for a large territory infarct or hyperdensity concerning for an ICH. Notable for prior L frontal encephalomalacia and L frontal cranioplasty.   CT angio Head and Neck with contrast: No LVO  CT Perfusion: No mismatch.  Impression   Benjamin Rowland is a 80 y.o. 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 Was given tPA, not a candidate for thrombectomy 2/2 LVO.  Primary Diagnosis:  Cerebral infarction, unspecified.  Secondary Diagnosis: Essential (primary) hypertension  Recommendations  Plan: - Frequent NeuroChecks for post tPA care per stroke unit protocol: - Initial CTH demonstrated no acute hemorrhage or mass - MRI Brain - pending. - CTA - no LVO - CT Perfusion - no mismatch. - TTE - pending. - Lipid Panel: LDL - pending.  - Statin: if LDL > 70 - HbA1c: pending. - Antithrombotic: Start ASA 81 mg daily if 24 h CTH does not show acute hemorrhage - DVT prophylaxis: SCDs. Pharmacologic prophylaxis if 24 h CTH does not demonstrate acute hemorrhage - Systolic Blood Pressure goal: < 180 mm Hg - Telemetry monitoring for arrhythmia: 72 hours - Swallow screen - ordered - PT/OT/SLP consults - Will admit to ICU for close monitoring given intermittently elevated SBP and now on Cleviprex.   Osteoarthritis: Hold off on home meloxicam given tPA.  ______________________________________________________________________  This patient is critically ill and at significant risk of neurological worsening, death and care requires constant monitoring of vital signs, hemodynamics,respiratory and cardiac monitoring, neurological assessment, discussion with family, other specialists and medical decision making of high complexity. I spent 90 minutes of neurocritical care time  in the care of  this patient. This was  time spent independent of any time provided by nurse practitioner or PA.  Erick Blinks [facility] Pager Number [phone] 03/05/2021  1:09 PM   Thank you for the opportunity to take part in the care of this patient. If you have any further questions, please contact the neurology consultation attending.  Signed,  Erick Blinks [facility] Pager Number [phone] _ _ _   _ __   _ __ _ _  __ __   _ __   __ _

## 2021-03-05 NOTE — Progress Notes (Signed)
1330: Dr. Derry Lory notified of change in neuro exam, patient drowsy with worsening R weakness and facial droop. STAT head ct obtained, tPA paused at 1335. Imaging reviewed by MD and tPA re-started at 1351.  1350: Significant redness and flushing of the face and neck, no c/o itching or difficulty breathing but patient states he feels "congested" and his tongue feels "a little clumsy". Dr. Donnald Garre at bedside with Dr. Derry Lory to assess, new orders placed for: 25mg  Benadryl, 20mg  Pepcid, 250mg  Solu-medrol IVPB.  , RN

## 2021-03-05 NOTE — ED Triage Notes (Signed)
Pt BIB [facility] for new onset confusion and R sided weakness.

## 2021-03-05 NOTE — Code Documentation (Signed)
Stroke Response Nurse Documentation Code Stroke Documentation Benjamin Rowland  is a 80 y.o.  y.o. male  arriving to [facility] ED via [facility] on 03/05/2021 with PMH of Osteoarthritis, Seizures, L crani with front lobe resection. Code stroke was activated by ED. 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 Patient taking No antithrombotic PTA. Stroke team met patient en route to CT, CBG 117, labs drawn and patient cleared for CT by Dr. Johnney Rowland. NIHSS 5, see documentation for details and code stroke times. Patient with right limb ataxia, right decreased sensation, dysarthria , and Sensory  neglect on exam. The following imaging was completed:  CT, CTA head and neck, CTP. Patient is a candidate for tPA, medication started with consent at 1231. VS/mNIHSS: q67m x2h, q49m x6h, q1h x16h. Bedside handoff with ED RN Benjamin Rowland.    [name]  Stroke Response RN [phone]

## 2021-03-05 NOTE — Progress Notes (Signed)
Brief Neuro Update:  Notified of facial flushing and redness with stuffy nose. He was given cleviprex, hydralazine, tPA and had gotten Iodinated contrast prior to this. Unclear what potentially caused this.  No tongue heaviness, no difficulty swallowing. Will continue tPa and cleviprex. I still think that the benefit of tPA and blod pressure control with cleviprex outweighs the potential risk for an allergic reaction. Will do one time dose of Solumedrol and Iv benadryl and monitor closely in the ICU.  Erick Blinks [facility] Pager Number [phone]

## 2021-03-06 ENCOUNTER — Inpatient Hospital Stay (HOSPITAL_COMMUNITY): Payer: Medicare Other

## 2021-03-06 DIAGNOSIS — I6389 Other cerebral infarction: Secondary | ICD-10-CM | POA: Diagnosis not present

## 2021-03-06 DIAGNOSIS — I6300 Cerebral infarction due to thrombosis of unspecified precerebral artery: Secondary | ICD-10-CM

## 2021-03-06 DIAGNOSIS — G9389 Other specified disorders of brain: Secondary | ICD-10-CM

## 2021-03-06 LAB — ECHOCARDIOGRAM COMPLETE
Area-P 1/2: 2 cm2
Height: 71 in
S' Lateral: 3.5 cm
Weight: 3111.13 oz

## 2021-03-06 LAB — LIPID PANEL
Cholesterol: 206 mg/dL — ABNORMAL HIGH (ref 0–200)
HDL: 50 mg/dL (ref >40)
LDL Cholesterol: 151 mg/dL — ABNORMAL HIGH (ref 0–99)
Total CHOL/HDL Ratio: 4.1 RATIO
Triglycerides: 26 mg/dL (ref <150)
VLDL: 5 mg/dL (ref 0–40)

## 2021-03-06 LAB — MRSA NEXT GEN BY PCR, NASAL: MRSA by PCR Next Gen: NOT DETECTED

## 2021-03-06 MED ORDER — ATORVASTATIN CALCIUM 80 MG PO TABS
80.0000 mg | ORAL_TABLET | Freq: Every day | ORAL | Status: DC
  Administered 2021-03-06 – 2021-03-07 (×2): 80 mg via ORAL
  Filled 2021-03-06 (×2): qty 1

## 2021-03-06 MED ORDER — ASPIRIN 81 MG PO CHEW
81.0000 mg | CHEWABLE_TABLET | Freq: Every day | ORAL | Status: DC
  Administered 2021-03-06 – 2021-03-08 (×3): 81 mg via ORAL
  Filled 2021-03-06 (×3): qty 1

## 2021-03-06 NOTE — Progress Notes (Addendum)
STROKE TEAM PROGRESS NOTE    Interval History   No acute events overnight, patient is resting in bed with his cousin and her husband at bedside. Neurological exam is pertinent for expressive aphasia and RUE weakness.   Discussed his prior surgery with him; he states that the left frontal lobe resection done in the 1980's at [facility] and gait was done to stop his seizures.   He was educated about his stroke, stroke risk factors and ongoing work up. Cousin also educated.   Of note, this morning his aphasia worsened thus a repeat CTH was obtained that was stable.   Pertinent Lab Work and Imaging    03/05/21 CT Head WO IV Contrast Acute infarct has become apparent in the left frontal parietal cortex. No acute hemorrhage.  03/05/21 CT Angio Head and Neck W WO IV Contrast CTA NECK FINDINGS   Aortic arch: Standard aortic branching. Atherosclerotic plaque within the visualized aortic arch and proximal major branch vessels of the neck. No hemodynamically significant innominate or proximal subclavian artery stenosis.   Right carotid system: CCA and ICA patent within the neck without stenosis. Minimal calcified plaque within the carotid bifurcation.Tortuosity of the distal cervical ICA.   Left carotid system: CCA and ICA patent within the neck without stenosis. Minimal soft and calcified plaque within the proximal ICA.   Vertebral arteries: Patent within the neck without hemodynamically significant stenosis (50% or greater). Mild soft and calcified plaque at the origin of the dominant left vertebral artery.   Skeleton: No acute bony abnormality or aggressive osseous lesion. 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 Cervical spondylosis.   CTA HEAD FINDINGS   Anterior circulation:   The intracranial internal carotid arteries are patent. Atherosclerotic plaque within both vessels with no more than mild stenosis. The M1 middle cerebral arteries are patent. Moderate stenosis within the distal left  M1 MCA segment (series 9, image 16). Atherosclerotic irregularity of the M2 and more distal middle cerebral artery branches bilaterally. However, no M2 proximal branch occlusion or high-grade proximal M2 stenosis is identified. The A1 right anterior cerebral artery is developmentally absent or markedly hypoplastic. The anterior cerebral arteries are otherwise patent.   1 mm laterally projecting vascular protrusion arising from the cavernous left ICA, which may reflect a tiny aneurysm (series 6, image 110).   Posterior circulation:   The intracranial vertebral arteries are patent. The basilar artery is patent. The posterior cerebral arteries are patent. Sites of moderate stenosis within the P1 and P2 right PCA segments. Moderate/severe stenosis within the left PCA at the P1/P2 junction (series 11, image 21). Additional bilateral PCA distal branch atherosclerotic irregularity. Posterior communicating arteries are hypoplastic or absent bilaterally.   Venous sinuses: Within the limitations of contrast timing, no convincing thrombus.  MRI Brain WO IV Contrast Acute infarct left frontal parietal lobe. This primarily involves the precentral cortex as well as involvement of the left parietal lobe and small areas of infarct in the left occipital lobe.   Chronic encephalomalacia left frontal lobe with evidence of prior left frontal craniotomy.  Echocardiogram Complete  Pending   Physical Examination   Constitutional: Pleasant elderly Caucasian male not in distress.  Calm, appropriate for condition  Cardiovascular: Normal RR Respiratory: No increased WOB   Mental status: AAOx4 Speech: Expressive aphasia, dysarthria nonfluent speech with significant word finding difficulty. Cranial nerves: EOMI, VFF, R FD, Tongue midline moderate right lower facial weakness. Motor: Normal bulk and tone. No drift.  Diminished fine finger movements on the right.  Orbits left over right upper extremity.  Dlt Bic Tri  FgS HF  KnF KnE PIF DoF  R 1 1 1  0 5 5 5 5 5   L 5 5 5 5 5 5 5 5 5   Sensory: Intact to light touch  Coordination: Intact FNF LUE  Gait: Deferred   Assessment and Plan   [name] is a 80 y.o. 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 He was within the time window to receive IVTPA thus received it. Not a candidate for thrombectomy due to lack of LVO.   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 At this time, stroke work up is ongoing. His CTA Head and Neck was pertinent for moderate stenosis within the distal left M1 MCA. MRI Brain showed an acute left frontal parietal stroke. Stroke labs w/LDL 151, A1C pending. Echocardiogram is also pending. Stroke etiology is cryptogenic; atherosclerosis versus cardioembolic.  - Follow up remaining stroke work up including echo and A1C  - Initiate Aspirin 81 mg 24 hours post IVTPA  - Atorvastatin 80 mg HS started  - At discharge will place an ambulatory referral to neurology for stroke follow up   CARDS #Elevated blood pressures  No documented history of hypertension. Recommend permissive hypertension 48 hours post stroke, given he received IVTPA SBP goal is < 180.  - Acute SBP goal < 180, pressures at goal  - Wean Clevidipine, will trend pressures and start on oral anti hypertensive's as indicated  He will likely need loop recorder at discharge for paroxysmal A. fib #Bradycardia  Noted to be bradycardic w/HR in the mid to upper 50s, asymptomatic  - If symptomatic will treat and consult cardiology   #Hyperlipidemia From a stroke prevention stand point, the LDL goal is < 70. His LDL is 151, thus he was started on Atorvastatin 80 mg HS   RESPIRATORY  Oxygenating well on RA, no acute  issues at this time   ENDO  #Stroke Diabetes Screening  Hemoglobin A1C this admission pending  - Follow up A1C   GI  No dysphagia due to stroke, passed for a regular diet. Bowel regimen in place with Senokot QD   RENAL  #?AKI  Cr noted to be 1.3 on CMP, unsure what baseline is  - Trend Cr and lytes  - Gentle hydration as necessary   HEME/ID  Hemoglobin, hematocrit and platelet count stable. No leukocytosis, afebrile.   Hospital day # 1  76, NP  [facility] Nurse Practitioner Patient seen and discussed with attending physician Dr. 11-11-1994  I have personally obtained history,examined this patient, reviewed notes, independently viewed imaging studies, participated in medical decision making and plan of care.ROS completed by me personally and pertinent positives fully documented  I have made any additions or clarifications directly to the above note. Agree with note above.  Patient presented with sudden onset of expressive aphasia and right facial droop secondary to embolic left MCA infarct and received IV tPA and is obtained partial improvement.  Continue close neurological monitoring and strict blood pressure control as per post tPA protocol.  Mobilize out of bed.  Therapy consults.  Check EEG for seizures.  He will likely need loop recorder at discharge for paroxysmal A. fibLong discussion with patient and his cousin and  her husband and answered questions. This patient is critically ill and at significant risk of neurological worsening, death and care requires constant monitoring of vital signs, hemodynamics,respiratory and cardiac monitoring, extensive review of multiple databases, frequent neurological assessment, discussion with family, other specialists and medical decision making of high complexity.I have made any additions or clarifications directly to the above note.This critical care time does not reflect procedure time, or teaching time or supervisory time of  PA/NP/Med Resident etc but could involve care discussion time.  I spent 30 minutes of neurocritical care time  in the care of  this patient.      Delia Heady, MD Medical Director [facility] Stroke [facility] Pager: [phone] 03/06/2021 4:45 PM   To contact Stroke Continuity provider, please refer to [email or web address]. After hours, contact General Neurology

## 2021-03-06 NOTE — Progress Notes (Signed)
EEG completed, results pending. 

## 2021-03-06 NOTE — Plan of Care (Signed)
Patient maintained SBP under 180 for this shift and has remained off Cleviprex. Patient eating 100% of his meals.

## 2021-03-06 NOTE — Progress Notes (Signed)
At 6:20am this morning patient stated he was having more difficulty than usual getting out his words. In comparison to his 5am assessment the patient's words were more slurred. I text paged neurosurgery and took the patient down for a Stat CT per Neurosurgery's order at 6:30am.

## 2021-03-06 NOTE — Progress Notes (Signed)
OT Cancellation Note  Patient Details Name: Benjamin Rowland MRN: [id number] DOB: Mar 06, 1941   Cancelled Treatment:    Reason Eval/Treat Not Completed: Patient at procedure or test/ unavailable (Pt was at MRI and now having EEG completed. Pt red, hot and flushed, RN aware.) OT to continue to follow for OT eval. Pt very drowsy and unable to fully arouse, might benefit from OT eval tomorrow.  Flora Lipps, OTR/L Acute Rehabilitation Services Pager: [phone] Office: [phone]   Flora Lipps 03/06/2021, 2:24 PM

## 2021-03-06 NOTE — Progress Notes (Signed)
PT Cancellation Note  Patient Details Name: Benjamin Rowland MRN: [id number] DOB: 09/01/1941   Cancelled Treatment:    Reason Eval/Treat Not Completed: Medical issues which prohibited therapy - BR orders, TPA administered 1220 on 6/20. Will check back.  Marye Round, PT DPT Acute Rehabilitation Services Pager [phone]  Office [phone]    Truddie Coco 03/06/2021, 8:40 AM

## 2021-03-06 NOTE — Procedures (Signed)
Patient Name: Benjamin Rowland  MRN: [id number]  Epilepsy Attending: Charlsie Quest  Referring Physician/Provider: Toni Amend heard, NP Date: 03/06/2021 Duration: 24.23 minutes  Patient history: 80 year old male with history of seizures status post cranial and left frontal resection presented with right-sided weakness.  EEG to evaluate for seizures.  Level of alertness: Awake, asleep  AEDs during EEG study: None  Technical aspects: This EEG study was done with scalp electrodes positioned according to the 10-20 International system of electrode placement. Electrical activity was acquired at a sampling rate of 500Hz  and reviewed with a high frequency filter of 70Hz  and a low frequency filter of 1Hz . EEG data were recorded continuously and digitally stored.   Description: The posterior dominant rhythm consists of 8 Hz activity of moderate voltage (25-35 uV) seen predominantly in posterior head regions, symmetric and reactive to eye opening and eye closing. Sleep was characterized by vertex waves, sleep spindles (12 to 14 Hz), maximal frontocentral region.  EEG showed continuous 3 to 5 Hz sharply contoured 3 to 5 Hz theta-delta slowing with overriding 13 to 18 Hz beta activity left frontal region consistent with breach artifact.  Hyperventilation and photic stimulation were not performed.     ABNORMALITY -Breach artifact, left frontal region  IMPRESSION: This study is suggestive of cortical dysfunction in left frontal region consistent with prior craniotomy.  No seizures or definite epileptiform discharges were seen throughout the recording.  Rollen Selders 

## 2021-03-06 NOTE — Evaluation (Signed)
Physical Therapy Evaluation Patient Details Name: Benjamin Rowland MRN: [id number] DOB: 10/31/1940 Today's Date: 03/06/2021   History of Present Illness  80 yo male presents to ED on 6/20 with R weakness while working on his [place]. MRI Brain shows an acute left frontal parietal stroke primarily involving precentral cortex, L parietal lobe, L occipital lobe, TPA administered 6/20 at 1220. 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  Clinical Impression   Pt presents with generalized weakness R>L, impaired balance, impaired coordination, impaired sensation, and difficulty performing mobility tasks. Pt to benefit from acute PT to address deficits. Pt requiring mod +2 assist for bed mobility, transfer to stand, and lateral stepping towards HOB. Pt too unsteady and fatigued to progress beyond this, + dizziness as well with SBP drop not technically orthostatic. PT recommending SNF for rehab given deficits and pt with little to no social support at home, states "I have no one (to help me)".  PT to progress mobility as tolerated, and will continue to follow acutely.      Follow Up Recommendations SNF;Supervision for mobility/OOB    Equipment Recommendations  None recommended by PT    Recommendations for Other Services       Precautions / Restrictions Precautions Precautions: Fall Restrictions Weight Bearing Restrictions: No      Mobility  Bed Mobility Overal bed mobility: Needs Assistance Bed Mobility: Supine to Sit;Sit to Supine     Supine to sit: Mod assist;HOB elevated;+2 for physical assistance Sit to supine: Mod assist;+2 for physical assistance;HOB elevated   General bed mobility comments: mod +2 for trunk and LE management RLE>LLE, boost up in bed upon return to supine.    Transfers Overall transfer level: Needs assistance Equipment used: 2 person hand held  assist Transfers: Sit to/from Stand Sit to Stand: Mod assist;+2 physical assistance         General transfer comment: mod +2 for power up, rise, steadying as pt with significant incoordination and posterior bias once standing. Lateral steps towards HOB x3 bilat, very small steps.  Ambulation/Gait             General Gait Details: nt  Information systems manager Rankin (Stroke Patients Only) Modified Rankin (Stroke Patients Only) Pre-Morbid Rankin Score: Slight disability Modified Rankin: Moderately severe disability     Balance Overall balance assessment: Needs assistance Sitting-balance support: No upper extremity supported;Feet supported Sitting balance-Leahy Scale: Fair     Standing balance support: Bilateral upper extremity supported;During functional activity Standing balance-Leahy Scale: Poor Standing balance comment: reliant on external support                             Pertinent Vitals/Pain Pain Assessment: Faces Faces Pain Scale: Hurts even more Pain Location: bilat knees Pain Descriptors / Indicators: Sore;Discomfort;Grimacing Pain Intervention(s): Limited activity within patient's tolerance;Monitored during session;Repositioned    Home Living Family/patient expects to be discharged to:: Private residence Living Arrangements: Alone Available Help at Discharge: Available PRN/intermittently;Family ("no one close") Type of Home: House Home Access: Stairs to enter Entrance Stairs-Rails: Can reach both Entrance Stairs-Number of Steps: 3 Home Layout: Able to live on main level with bedroom/bathroom;Multi-level Home Equipment: Dan Humphreys - 2 wheels;Crutches      Prior Function Level of Independence: Independent  Comments: using crutches for mobility due to bilat knee pain     Hand Dominance   Dominant Hand: Right    Extremity/Trunk Assessment   Upper Extremity Assessment Upper Extremity  Assessment: Defer to OT evaluation    Lower Extremity Assessment Lower Extremity Assessment: Generalized weakness;RLE deficits/detail (impaired coordination in standing) RLE Deficits / Details: + fasciculations in bilat quads R>L; able to perform heel slide, SLR off of bed, LAQ RLE Sensation: decreased light touch;decreased proprioception    Cervical / Trunk Assessment Cervical / Trunk Assessment: Normal  Communication   Communication: Expressive difficulties  Cognition Arousal/Alertness: Awake/alert Behavior During Therapy: WFL for tasks assessed/performed Overall Cognitive Status: Impaired/Different from baseline Area of Impairment: Attention;Following commands;Safety/judgement;Problem solving                   Current Attention Level: Sustained   Following Commands: Follows one step commands with increased time Safety/Judgement: Decreased awareness of safety   Problem Solving: Slow processing;Decreased initiation;Requires verbal cues;Requires tactile cues;Difficulty sequencing General Comments: follows commands well with increased time, demonstrates expressive difficulties so responses take increased time as well      General Comments General comments (skin integrity, edema, etc.): <20 mmHg drop in SBP when moving from sitting>standing (160s-140s), + dizziness    Exercises     Assessment/Plan    PT Assessment Patient needs continued PT services  PT Problem List Decreased strength;Decreased mobility;Decreased activity tolerance;Decreased balance;Decreased knowledge of use of DME;Decreased coordination;Impaired sensation;Cardiopulmonary status limiting activity;Decreased knowledge of precautions;Decreased safety awareness       PT Treatment Interventions DME instruction;Therapeutic activities;Gait training;Therapeutic exercise;Patient/family education;Balance training;Functional mobility training;Neuromuscular re-education    PT Goals (Current goals can be found in  the Care Plan section)  Acute Rehab PT Goals Patient Stated Goal: back to normal PT Goal Formulation: With patient Time For Goal Achievement: 03/20/21 Potential to Achieve Goals: Good    Frequency Min 3X/week   Barriers to discharge        Co-evaluation PT/OT/SLP Co-Evaluation/Treatment: Yes Reason for Co-Treatment: For patient/therapist safety;To address functional/ADL transfers PT goals addressed during session: Mobility/safety with mobility;Balance         AM-PAC PT "6 Clicks" Mobility  Outcome Measure Help needed turning from your back to your side while in a flat bed without using bedrails?: A Little Help needed moving from lying on your back to sitting on the side of a flat bed without using bedrails?: A Lot Help needed moving to and from a bed to a chair (including a wheelchair)?: A Lot Help needed standing up from a chair using your arms (e.g., wheelchair or bedside chair)?: A Lot Help needed to walk in hospital room?: A Lot Help needed climbing 3-5 steps with a railing? : Total 6 Click Score: 12    End of Session Equipment Utilized During Treatment: Gait belt Activity Tolerance: Patient tolerated treatment well;Patient limited by fatigue Patient left: in bed;with call bell/phone within reach;with bed alarm set;with SCD's reapplied Nurse Communication: Mobility status PT Visit Diagnosis: Unsteadiness on feet (R26.81);Muscle weakness (generalized) (M62.81);Difficulty in walking, not elsewhere classified (R26.2)    Time: 2229-7989 PT Time Calculation (min) (ACUTE ONLY): 34 min   Charges:   PT Evaluation $PT Eval Low Complexity: 1 Low         Oona Trammel S, PT DPT Acute Rehabilitation Services Pager [phone]  Office [phone]   Truddie Coco 03/06/2021, 6:17 PM

## 2021-03-06 NOTE — Progress Notes (Signed)
Pt is currently unavailable for EEG. Pt is at MRI. Will check back when schedule permits

## 2021-03-06 NOTE — Evaluation (Signed)
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 MRI Brain shows an acute left frontal parietal stroke primarily involving precentral cortex, L parietal lobe, L occipital lobe, TPA administered 6/20 at 1220. 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   Clinical Impression   Pt PTA: Pt living alone and very independent. Pt currently, limited by severe R sided weakness, poor coordination and RLE weakness. Pt taking steps to [facility], but forcing BLEs to take steps and vefy difficult due to poor motor planning. BP sitting: 164/84; standing 146/93 dizziness reported. Speech slurred, but cognition appears intact. Pt wears glasses and able to read OTR's name tag. Pt set-upA to maxA for ADL and modA +2 for sit to stand and attempts to take steps. Pt would benefit from continued OT skilled services. OT following acutely.    Follow Up Recommendations  SNF    Equipment Recommendations  3 in 1 bedside commode    Recommendations for Other Services       Precautions / Restrictions Precautions Precautions: Fall Restrictions Weight Bearing Restrictions: No      Mobility Bed Mobility Overal bed mobility: Needs Assistance Bed Mobility: Supine to Sit;Sit to Supine     Supine to sit: Mod assist;HOB elevated;+2 for physical assistance Sit to supine: Mod assist;+2 for physical assistance;HOB elevated   General bed mobility comments: mod +2 for trunk and LE management RLE>LLE, boost up in bed upon return to supine.    Transfers Overall transfer level: Needs assistance Equipment used: 2 person hand held assist Transfers: Sit to/from Stand Sit to Stand: Mod assist;+2 physical assistance          General transfer comment: mod +2 for power up assist for stability and taking 3 steps to [facility]    Balance Overall balance assessment: Needs assistance Sitting-balance support: No upper extremity supported;Feet supported Sitting balance-Leahy Scale: Fair     Standing balance support: Bilateral upper extremity supported;During functional activity Standing balance-Leahy Scale: Poor Standing balance comment: reliant on external support                           ADL either performed or assessed with clinical judgement   ADL Overall ADL's : Needs assistance/impaired Eating/Feeding: Minimal assistance;Sitting   Grooming: Minimal assistance;Sitting   Upper Body Bathing: Moderate assistance;Sitting   Lower Body Bathing: Maximal assistance   Upper Body Dressing : Moderate assistance;Sitting   Lower Body Dressing: Maximal assistance   Toilet Transfer: Moderate assistance;+2 for safety/equipment;BSC;Stand-pivot   Toileting- Clothing Manipulation and Hygiene: Maximal assistance       Functional mobility during ADLs: Moderate assistance;+2 for physical assistance;+2 for safety/equipment;Cueing for safety;Cueing for sequencing General ADL Comments: Pt limited by severe R sided weakness, poor coordination and RLE weakness. Pt taking steps to [facility], but forcing BLEs to take steps and vefy difficult due to poor motor planning.     Vision Baseline Vision/History: Wears glasses Wears Glasses: At all times Patient Visual Report: No change from baseline Vision Assessment?: Yes Eye Alignment: Within Functional Limits Ocular Range of Motion: Within Functional Limits Alignment/Gaze Preference: Within Defined Limits     Perception     Praxis      Pertinent  Vitals/Pain Pain Assessment: Faces Faces Pain Scale: Hurts even more Pain Location: bilat knees Pain Descriptors / Indicators: Sore;Discomfort;Grimacing Pain Intervention(s): Limited activity within patient's  tolerance;Monitored during session     Hand Dominance Right   Extremity/Trunk Assessment Upper Extremity Assessment Upper Extremity Assessment: RUE deficits/detail;LUE deficits/detail RUE Deficits / Details: 1/5 MM grade flicker of movement RUE Sensation: decreased light touch RUE Coordination: decreased fine motor;decreased gross motor LUE Deficits / Details: AROM, WFLs   Lower Extremity Assessment Lower Extremity Assessment: Defer to PT evaluation RLE Deficits / Details: + fasciculations in bilat quads R>L; able to perform heel slide, SLR off of bed, LAQ RLE Sensation: decreased light touch;decreased proprioception   Cervical / Trunk Assessment Cervical / Trunk Assessment: Normal   Communication Communication Communication: Expressive difficulties   Cognition Arousal/Alertness: Awake/alert Behavior During Therapy: WFL for tasks assessed/performed Overall Cognitive Status: Impaired/Different from baseline Area of Impairment: Attention;Following commands;Safety/judgement;Problem solving                   Current Attention Level: Sustained   Following Commands: Follows one step commands with increased time Safety/Judgement: Decreased awareness of safety   Problem Solving: Slow processing;Decreased initiation;Requires verbal cues;Requires tactile cues;Difficulty sequencing General Comments: follows commands well with increased time, demonstrates expressive difficulties; jovial.   General Comments  BP sitting: 164/84; standing 146/93 dizziness reported.    Exercises     Shoulder Instructions      Home Living Family/patient expects to be discharged to:: Private residence Living Arrangements: Alone Available Help at Discharge: Available PRN/intermittently;Family ("no one close") Type of Home: House Home Access: Stairs to enter Secretary/administrator of Steps: 3 Entrance Stairs-Rails: Can reach both Home Layout: Able to live on main level with  bedroom/bathroom;Multi-level     Bathroom Shower/Tub: Walk-in shower         Home Equipment: Environmental consultant - 2 wheels;Crutches          Prior Functioning/Environment Level of Independence: Independent        Comments: using crutches for mobility due to bilat knee pain        OT Problem List: Decreased strength;Impaired balance (sitting and/or standing);Decreased activity tolerance;Impaired vision/perception;Decreased coordination;Decreased cognition;Impaired UE functional use;Decreased safety awareness;Increased edema;Impaired tone;Impaired sensation      OT Treatment/Interventions: Self-care/ADL training;Therapeutic exercise;Neuromuscular education;Energy conservation;Therapeutic activities;Visual/perceptual remediation/compensation;Patient/family education;Balance training    OT Goals(Current goals can be found in the care plan section) Acute Rehab OT Goals Patient Stated Goal: back to normal OT Goal Formulation: With patient Time For Goal Achievement: 03/20/21 Potential to Achieve Goals: Good ADL Goals Pt Will Perform Grooming: with set-up;sitting Pt Will Perform Upper Body Dressing: with min assist;sitting Pt Will Perform Lower Body Dressing: with mod assist;sitting/lateral leans;sit to/from stand Pt Will Transfer to Toilet: with mod assist;squat pivot transfer;bedside commode Pt/caregiver will Perform Home Exercise Program: Increased strength;Right Upper extremity;With minimal assist;With written HEP provided Additional ADL Goal #1: Pt will increase to minA for bed mobility as precursor for ADL.  OT Frequency: Min 2X/week   Barriers to D/C: Decreased caregiver support          Co-evaluation PT/OT/SLP Co-Evaluation/Treatment: Yes Reason for Co-Treatment: Complexity of the patient's impairments (multi-system involvement);To address functional/ADL transfers;For patient/therapist safety PT goals addressed during session: Mobility/safety with mobility;Balance OT goals  addressed during session: ADL's and self-care      AM-PAC OT "6 Clicks" Daily Activity     Outcome Measure Help from another person eating meals?: A Little Help from another person taking care of personal grooming?: A  Little Help from another person toileting, which includes using toliet, bedpan, or urinal?: A Lot Help from another person bathing (including washing, rinsing, drying)?: A Lot Help from another person to put on and taking off regular upper body clothing?: A Lot Help from another person to put on and taking off regular lower body clothing?: A Lot 6 Click Score: 14   End of Session Equipment Utilized During Treatment: Gait belt Nurse Communication: Mobility status  Activity Tolerance: Patient tolerated treatment well Patient left: in bed;with call bell/phone within reach;with bed alarm set  OT Visit Diagnosis: Unsteadiness on feet (R26.81);Muscle weakness (generalized) (M62.81);Hemiplegia and hemiparesis Hemiplegia - Right/Left: Right Hemiplegia - dominant/non-dominant: Dominant Hemiplegia - caused by: Cerebral infarction                Time: 1450-1524 OT Time Calculation (min): 34 min Charges:  OT General Charges $OT Visit: 1 Visit OT Evaluation $OT Eval Moderate Complexity: 1 Mod  Flora Lipps, OTR/L Acute Rehabilitation Services Pager: [phone] Office: [phone]   Lonzo Cloud 03/06/2021, 9:27 PM

## 2021-03-06 NOTE — Progress Notes (Signed)
Echocardiogram 2D Echocardiogram has been performed.  Benjamin Rowland 03/06/2021, 2:38 PM

## 2021-03-07 LAB — BASIC METABOLIC PANEL
Anion gap: 9 (ref 5–15)
BUN: 20 mg/dL (ref 8–23)
CO2: 21 mmol/L — ABNORMAL LOW (ref 22–32)
Calcium: 9 mg/dL (ref 8.9–10.3)
Chloride: 107 mmol/L (ref 98–111)
Creatinine, Ser: 1.32 mg/dL — ABNORMAL HIGH (ref 0.61–1.24)
GFR, Estimated: 55 mL/min — ABNORMAL LOW (ref >60)
Glucose, Bld: 104 mg/dL — ABNORMAL HIGH (ref 70–99)
Potassium: 4.1 mmol/L (ref 3.5–5.1)
Sodium: 137 mmol/L (ref 135–145)

## 2021-03-07 LAB — CBC
HCT: 47.7 % (ref 39.0–52.0)
Hemoglobin: 15.8 g/dL (ref 13.0–17.0)
MCH: 30.6 pg (ref 26.0–34.0)
MCHC: 33.1 g/dL (ref 30.0–36.0)
MCV: 92.4 fL (ref 80.0–100.0)
Platelets: 250 10*3/uL (ref 150–400)
RBC: 5.16 MIL/uL (ref 4.22–5.81)
RDW: 14.3 % (ref 11.5–15.5)
WBC: 12.2 10*3/uL — ABNORMAL HIGH (ref 4.0–10.5)
nRBC: 0 % (ref 0.0–0.2)

## 2021-03-07 LAB — SARS CORONAVIRUS 2 (TAT 6-24 HRS): SARS Coronavirus 2: NEGATIVE

## 2021-03-07 LAB — GLUCOSE, CAPILLARY
Glucose-Capillary: 103 mg/dL — ABNORMAL HIGH (ref 70–99)
Glucose-Capillary: 100 mg/dL — ABNORMAL HIGH (ref 70–99)
Glucose-Capillary: 92 mg/dL (ref 70–99)

## 2021-03-07 LAB — HEMOGLOBIN A1C
Hgb A1c MFr Bld: 5.7 % — ABNORMAL HIGH (ref 4.8–5.6)
Mean Plasma Glucose: 117 mg/dL

## 2021-03-07 MED ORDER — PANTOPRAZOLE SODIUM 40 MG PO TBEC
40.0000 mg | DELAYED_RELEASE_TABLET | Freq: Every day | ORAL | Status: DC
  Administered 2021-03-07: 40 mg via ORAL
  Filled 2021-03-07: qty 1

## 2021-03-07 MED ORDER — LISINOPRIL 2.5 MG PO TABS
5.0000 mg | ORAL_TABLET | Freq: Every day | ORAL | Status: DC
  Administered 2021-03-07 – 2021-03-08 (×2): 5 mg via ORAL
  Filled 2021-03-07 (×2): qty 2

## 2021-03-07 NOTE — Progress Notes (Addendum)
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 MRI Brain shows an acute left frontal parietal stroke primarily involving precentral cortex, L parietal lobe, L occipital lobe, TPA administered 6/20 at 1220. 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    PT Comments    Pt resting in bed upon PT arrival, agreeable to OOB mobility but appears disheartened stating SLP session this am went "bad" given his speech. Pt  requiring mod-max +2 assist for bed mobility and stand pivot to recliner, stand pivot made more difficult due to pt having difficulty following commands once standing. Pt with wide BOS, ataxic-like stepping at this time. PT to continue to follow acutely, SNF remains appropriate dispo.   BP sitting EOB 182/91, sitting in recliner 204/75, repeat sitting in recliner 175/88; RN notified.    Follow Up Recommendations  SNF;Supervision for mobility/OOB     Equipment Recommendations  None recommended by PT    Recommendations for Other Services       Precautions / Restrictions Precautions Precautions: Fall Restrictions Weight Bearing Restrictions: No    Mobility  Bed Mobility Overal bed mobility: Needs Assistance Bed Mobility: Supine to Sit;Sit to Supine     Supine to sit: Mod assist;HOB elevated     General bed mobility comments: mod assist for truncal elevation via HHA, scooting to EOB with bed pad.    Transfers Overall transfer level: Needs assistance Equipment used: 2 person hand held assist Transfers: Sit to/from [employer] Sit to Stand: Mod assist;+2 physical assistance Stand pivot transfers: Max assist;+2 physical assistance       General  transfer comment: mod-max +2 for power up, rise, steady, and wide-based stepping to reach recliner. Pt with difficutly following cues once standing and pivoting, does not follow sequencing cues for foot placement.  Ambulation/Gait             General Gait Details: nt   Social research officer, government Rankin (Stroke Patients Only) Modified Rankin (Stroke Patients Only) Pre-Morbid Rankin Score: Slight disability Modified Rankin: Moderately severe disability     Balance Overall balance assessment: Needs assistance Sitting-balance support: No upper extremity supported;Feet supported Sitting balance-Leahy Scale: Fair     Standing balance support: Bilateral upper extremity supported;During functional activity Standing balance-Leahy Scale: Poor Standing balance comment: reliant on external support                            Cognition Arousal/Alertness: Awake/alert Behavior During Therapy: WFL for tasks assessed/performed;Impulsive Overall Cognitive Status: Impaired/Different from baseline Area of Impairment: Attention;Following commands;Safety/judgement;Problem solving                   Current Attention Level: Sustained Memory: Decreased short-term memory Following Commands: Follows one step commands with increased time Safety/Judgement: Decreased awareness of safety   Problem Solving: Slow processing;Decreased initiation;Requires verbal cues;Requires tactile cues;Difficulty sequencing General Comments: pt requires cuing several times throughout session to wait for PT assist, attempts to mobilize without warning.      Exercises General Exercises - Lower Extremity Ankle Circles/Pumps: AROM;Both;15 reps;Seated Long Arc Quad: AROM;Both;15 reps;Seated  Hip ABduction/ADduction: AROM;Both;Seated;15 reps Straight Leg Raises: AROM;Both;10 reps;Seated    General Comments General comments (skin integrity, edema, etc.): BP sitting EOB  182/91, sitting in recliner 204/75, repeat sitting in recliner 175/88; RN notified.      Pertinent Vitals/Pain Pain Assessment: Faces Faces Pain Scale: Hurts even more Pain Location: bilat knees L>R Pain Descriptors / Indicators: Sore;Discomfort;Grimacing Pain Intervention(s): Limited activity within patient's tolerance;Monitored during session;Repositioned    Home Living     Available Help at Discharge: Available PRN/intermittently;Family Type of Home: House              Prior Function            PT Goals (current goals can now be found in the care plan section) Acute Rehab PT Goals Patient Stated Goal: back to normal PT Goal Formulation: With patient Time For Goal Achievement: 03/20/21 Potential to Achieve Goals: Good Progress towards PT goals: Progressing toward goals    Frequency    Min 3X/week      PT Plan Current plan remains appropriate    Co-evaluation              AM-PAC PT "6 Clicks" Mobility   Outcome Measure  Help needed turning from your back to your side while in a flat bed without using bedrails?: A Little Help needed moving from lying on your back to sitting on the side of a flat bed without using bedrails?: A Lot Help needed moving to and from a bed to a chair (including a wheelchair)?: A Lot Help needed standing up from a chair using your arms (e.g., wheelchair or bedside chair)?: A Lot Help needed to walk in hospital room?: A Lot Help needed climbing 3-5 steps with a railing? : Total 6 Click Score: 12    End of Session Equipment Utilized During Treatment: Gait belt Activity Tolerance: Patient tolerated treatment well;Patient limited by fatigue Patient left: with call bell/phone within reach;in chair;with chair alarm set Nurse Communication: Mobility status;Need for lift equipment (stedy or +2 stand pivot back to bed) PT Visit Diagnosis: Unsteadiness on feet (R26.81);Muscle weakness (generalized) (M62.81);Difficulty in walking, not  elsewhere classified (R26.2)     Time: 8250-5397 PT Time Calculation (min) (ACUTE ONLY): 28 min  Charges:  $Therapeutic Exercise: 8-22 mins $Therapeutic Activity: 8-22 mins                     Marye Round, PT DPT Acute Rehabilitation Services Pager [phone]  Office [phone]    Benjamin Rowland 03/07/2021, 12:01 PM

## 2021-03-07 NOTE — Evaluation (Signed)
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 Past Surgical History: No past surgical history on file. 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 MRI Brain shows an acute left frontal parietal stroke primarily involving precentral cortex, L parietal lobe, L occipital lobe, TPA administered 6/20 at 1220. 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  Speech is non-fluent with some notable groping and phoneme repetitions.  Confrontational and responsive naming were WNL; divergent naming was limited, generating only four items during category generation task.  Propositional speech was marked by adequate syntax, hesitations/groping during word retrieval with good recognition of deficits.  Pt followed one and two step commands; had difficulty with complex commands.  Repetition was intact for simple phonetic productions.    Pt will benefit from speech therapy to address aphasia and dysarthria while admitted and at next level of care. We discussed results of assessment and good prognosis for recovery of communication. Pt is appropriately concerned about overall recovery, particularly RUE. Provided support and active listening.  SLP will follow.    SLP Assessment  SLP Recommendation/Assessment: Patient needs continued Speech  [place] [facility] SLP Visit Diagnosis: Aphasia (R47.01)    Follow Up Recommendations  Skilled Nursing facility    Frequency and Duration min 2x/week  1 week      SLP Evaluation Cognition  Overall Cognitive Status: Impaired/Different from baseline Arousal/Alertness: Awake/alert Orientation Level: Oriented X4 Attention: Sustained       Comprehension  Auditory Comprehension Overall Auditory Comprehension: Impaired Yes/No Questions: Within Functional Limits Commands: Impaired Complex Commands: 50-74% accurate Conversation: Simple Visual Recognition/Discrimination Discrimination: Within Function Limits Reading Comprehension Reading Status: Not tested    Expression Expression Primary Mode of Expression: Verbal Verbal Expression Overall Verbal Expression: Impaired Initiation: No impairment Automatic Speech: Month of year Level of Generative/Spontaneous Verbalization: Conversation Repetition: No impairment Naming: Impairment Responsive: 76-100% accurate Confrontation: Within functional limits Convergent: Not tested Divergent: 25-49% accurate Verbal Errors: Aware of errors Pragmatics: No impairment Written Expression Dominant Hand: Right Written Expression: Not tested   Oral / Motor  Oral Motor/Sensory Function Overall Oral Motor/Sensory Function: Moderate impairment Facial Symmetry: Abnormal symmetry right;Suspected CN VII (facial) dysfunction Facial Strength: Reduced right Lingual Symmetry: Within Functional Limits Motor Speech Overall Motor Speech: Impaired Respiration: Within functional limits Phonation: Normal Resonance: Within functional limits Articulation: Impaired Level of Impairment: Phrase Intelligibility: Intelligibility reduced Word: 75-100% accurate Phrase: 50-74% accurate Sentence: 50-74% accurate Motor Planning: Impaired Level of Impairment: Sentence Motor Speech Errors: Groping for words   GO                    [name]  [name] 03/07/2021, 10:33 AM  Marchelle Folks L. Samson Frederic, MA CCC/SLP Acute Rehabilitation Services Office number [phone] Pager [phone]

## 2021-03-07 NOTE — NC FL2 (Signed)
[place] MEDICAID FL2 LEVEL OF CARE SCREENING TOOL     IDENTIFICATION  Patient Name: Benjamin Rowland Birthdate: 1941-02-14 Sex: male Admission Date (Current Location): 03/05/2021  [facility] and [place] Number:  Producer, television/film/video and Address:  [facility] [place], [address], [place], [place] [address]      Provider Number: [id number]  Attending Physician Name and Address:  Stroke, Md, MD  Relative Name and Phone Number:       Current Level of Care: Hospital Recommended Level of Care: Skilled Nursing Facility Prior Approval Number:    Date Approved/Denied:   PASRR Number: [id number]  Discharge Plan: SNF    Current Diagnoses: Patient Active Problem List   Diagnosis Date Noted   Acute ischemic left MCA stroke (HCC) 03/05/2021   Primary osteoarthritis of left knee 10/28/2019   Chronic pain of left knee 10/05/2019    Orientation RESPIRATION BLADDER Height & Weight     Self, Time, Situation, Place  Normal Incontinent Weight: 88.2 kg Height:  5\' 11"  (180.3 cm)  BEHAVIORAL SYMPTOMS/MOOD NEUROLOGICAL BOWEL NUTRITION STATUS      Continent Diet (Regular with thin liquids)  AMBULATORY STATUS COMMUNICATION OF NEEDS Skin   Extensive Assist Verbally Normal                       Personal Care Assistance Level of Assistance  Bathing, Feeding, Dressing Bathing Assistance: Maximum assistance Feeding assistance: Limited assistance Dressing Assistance: Maximum assistance     Functional Limitations Info  Sight, Hearing, Speech Sight Info: Impaired Hearing Info: Impaired Speech Info: Impaired (dysarthria)    SPECIAL CARE FACTORS FREQUENCY  PT (By licensed PT), OT (By licensed OT), Speech therapy     PT Frequency: 5x/wk OT Frequency: 5x/wk     Speech Therapy Frequency: 5x/wk      Contractures Contractures Info: Not present    Additional Factors Info  Code Status, Allergies Code Status Info: Full Allergies Info: NKA           Current  Medications (03/07/2021):  This is the current hospital active medication list Current Facility-Administered Medications  Medication Dose Route Frequency Provider Last Rate Last Admin   0.9 %  sodium chloride infusion   Intravenous Continuous 03/09/2021, MD 75 mL/hr at 03/07/21 0948 Infusion Verify at 03/07/21 0948   acetaminophen (TYLENOL) tablet 650 mg  650 mg Oral Q4H PRN 03/09/21, MD   650 mg at 03/06/21 1020   Or   acetaminophen (TYLENOL) 160 MG/5ML solution 650 mg  650 mg Per Tube Q4H PRN 03/08/21, MD       Or   acetaminophen (TYLENOL) suppository 650 mg  650 mg Rectal Q4H PRN Erick Blinks, MD       aspirin chewable tablet 81 mg  81 mg Oral Daily Heard, Courtney S, NP   81 mg at 03/07/21 0946   atorvastatin (LIPITOR) tablet 80 mg  80 mg Oral QHS 03/09/21, NP   80 mg at 03/06/21 2138   Chlorhexidine Gluconate Cloth 2 % PADS 6 each  6 each Topical Q0600 2139, MD       lisinopril (ZESTRIL) tablet 5 mg  5 mg Oral Daily Erick Blinks S, NP   5 mg at 03/07/21 0946   MEDLINE mouth rinse  15 mL Mouth Rinse BID 03/09/21, MD   15 mL at 03/07/21 0948   pantoprazole (PROTONIX) EC tablet 40 mg  40 mg Oral QHS 03/09/21  K, RPH       senna-docusate (Senokot-S) tablet 1 tablet  1 tablet Oral QHS PRN Erick Blinks, MD         Discharge Medications: Please see discharge summary for a list of discharge medications.  Relevant Imaging Results:  Relevant Lab Results:   Additional Information SS#: [id number]  Kermit Balo, RN

## 2021-03-07 NOTE — Plan of Care (Signed)
  Problem: Education: Goal: Knowledge of General Education information will improve Description: Including pain rating scale, medication(s)/side effects and non-pharmacologic comfort measures Outcome: Progressing   Problem: Health Behavior/Discharge Planning: Goal: Ability to manage health-related needs will improve Outcome: Progressing   Problem: Clinical Measurements: Goal: Ability to maintain clinical measurements within normal limits will improve Outcome: Progressing Goal: Will remain free from infection Outcome: Progressing Goal: Diagnostic test results will improve Outcome: Progressing Goal: Respiratory complications will improve Outcome: Progressing Goal: Cardiovascular complication will be avoided Outcome: Progressing   Problem: Activity: Goal: Risk for activity intolerance will decrease Outcome: Progressing   Problem: Nutrition: Goal: Adequate nutrition will be maintained Outcome: Progressing   Problem: Coping: Goal: Level of anxiety will decrease Outcome: Progressing   Problem: Elimination: Goal: Will not experience complications related to bowel motility Outcome: Progressing Goal: Will not experience complications related to urinary retention Outcome: Progressing   Problem: Pain Managment: Goal: General experience of comfort will improve Outcome: Progressing   Problem: Safety: Goal: Ability to remain free from injury will improve Outcome: Progressing   Problem: Skin Integrity: Goal: Risk for impaired skin integrity will decrease Outcome: Progressing   Problem: Education: Goal: Knowledge of disease or condition will improve Outcome: Progressing Goal: Knowledge of secondary prevention will improve Outcome: Progressing Goal: Knowledge of patient specific risk factors addressed and post discharge goals established will improve Outcome: Progressing Goal: Individualized Educational Video(s) Outcome: Progressing   Problem: Coping: Goal: Will verbalize  positive feelings about self Outcome: Progressing Goal: Will identify appropriate support needs Outcome: Progressing   Problem: Health Behavior/Discharge Planning: Goal: Ability to manage health-related needs will improve Outcome: Progressing   Problem: Self-Care: Goal: Ability to participate in self-care as condition permits will improve Outcome: Progressing Goal: Verbalization of feelings and concerns over difficulty with self-care will improve Outcome: Progressing Goal: Ability to communicate needs accurately will improve Outcome: Progressing   Problem: Nutrition: Goal: Risk of aspiration will decrease Outcome: Progressing Goal: Dietary intake will improve Outcome: Progressing

## 2021-03-07 NOTE — TOC Initial Note (Addendum)
Transition of Care [facility]) - Initial/Assessment Note    Patient Details  Name: Benjamin Rowland MRN: [id number] Date of Birth: [birth year]  [facility] CM/SW Contact:    Pollie Friar, RN Phone Number: 03/07/2021, 11:55 AM  Clinical Narrative:                 CM met with the patient and due to his aphasia he asked that I talk to his cousin, [name]. CM called [name] and she is interested in having pt attend SNF rehab prior to returning home. CM inquired about the area and she was interested in [place] and [place]. CM has faxed him out and will f/u with [name] on bed offers as they come available.  TOC following.  1540: CM has sent bed offers to pts cousin, [name].    Expected Discharge Plan: Skilled Nursing Facility Barriers to Discharge: Continued Medical Work up   Patient Goals and CMS Choice   CMS Medicare.gov Compare Post Acute Care list provided to:: Patient Choice offered to / list presented to : Patient (cousin: Marketing executive)  Expected Discharge Plan and Services Expected Discharge Plan: [place] In-house Referral: Clinical Social Work Discharge Planning Services: CM Consult Post Acute Care Choice: [place] Living arrangements for the past 2 months: [place]                                      Prior Living Arrangements/Services Living arrangements for the past 2 months: Single Family Home Lives with:: Self Patient language and need for interpreter reviewed:: Yes Do you feel safe going back to the place where you live?: Yes      Need for Family Participation in Patient Care: Yes (Comment) Care giver support system in place?: No (comment)   Criminal Activity/Legal Involvement Pertinent to Current Situation/Hospitalization: No - Comment as needed  Activities of Daily Living Home Assistive Devices/Equipment: Crutches (for L hip) ADL Screening (condition at time of admission) Patient's cognitive ability  adequate to safely complete daily activities?: Yes Is the patient deaf or have difficulty hearing?: Yes Does the patient have difficulty seeing, even when wearing glasses/contacts?: No Does the patient have difficulty concentrating, remembering, or making decisions?: No Patient able to express need for assistance with ADLs?: Yes Does the patient have difficulty dressing or bathing?: No Independently performs ADLs?: Yes (appropriate for developmental age) Does the patient have difficulty walking or climbing stairs?: No Weakness of Legs: None Weakness of Arms/Hands: None  Permission Sought/Granted                  Emotional Assessment Appearance:: Appears stated age Attitude/Demeanor/Rapport:  (dysarthria) Affect (typically observed): Accepting Orientation: : Oriented to Self, Oriented to Place, Oriented to  Time, Oriented to Situation   Psych Involvement: No (comment)  Admission diagnosis:  Stroke (cerebrum) [facility]) [I63.9] Patient Active Problem List   Diagnosis Date Noted   Acute ischemic left MCA stroke ([place]) 03/05/2021   Primary osteoarthritis of left knee 10/28/2019   Chronic pain of left knee 10/05/2019   PCP:  Leonard Downing, MD Pharmacy:   [id number] [address], [place] [address] Phone: [phone] Fax: [phone]     Social Determinants of Health (SDOH) Interventions    Readmission Risk Interventions No flowsheet data found.

## 2021-03-07 NOTE — Progress Notes (Addendum)
STROKE TEAM PROGRESS NOTE    Interval History  No acute events overnight. Patient is resting in bed, no family at bedside.   Neurological examination is stable- he continues to have expressive aphasia and RUE weakness with proximal > distal strength  Therapist recommend skilled nursing facility for rehab. Pertinent Lab Work and Imaging    03/05/21 CT Head WO IV Contrast Acute infarct has become apparent in the left frontal parietal cortex. No acute hemorrhage.  03/05/21 CT Angio Head and Neck W WO IV Contrast CTA NECK FINDINGS   Aortic arch: Standard aortic branching. Atherosclerotic plaque within the visualized aortic arch and proximal major branch vessels of the neck. No hemodynamically significant innominate or proximal subclavian artery stenosis.   Right carotid system: CCA and ICA patent within the neck without stenosis. Minimal calcified plaque within the carotid bifurcation.Tortuosity of the distal cervical ICA.   Left carotid system: CCA and ICA patent within the neck without stenosis. Minimal soft and calcified plaque within the proximal ICA.   Vertebral arteries: Patent within the neck without hemodynamically significant stenosis (50% or greater). Mild soft and calcified plaque at the origin of the dominant left vertebral artery.   Skeleton: No acute bony abnormality or aggressive osseous lesion. 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 Cervical spondylosis.   CTA HEAD FINDINGS   Anterior circulation:   The intracranial internal carotid arteries are patent. Atherosclerotic plaque within both vessels with no more than mild stenosis. The M1 middle cerebral arteries are patent. Moderate stenosis within the distal left M1 MCA segment (series 9, image 16). Atherosclerotic irregularity of the M2 and more distal middle cerebral artery branches bilaterally. However, no M2 proximal branch occlusion or high-grade proximal M2 stenosis is identified. The A1 right anterior cerebral  artery is developmentally absent or markedly hypoplastic. The anterior cerebral arteries are otherwise patent.   1 mm laterally projecting vascular protrusion arising from the cavernous left ICA, which may reflect a tiny aneurysm (series 6, image 110).   Posterior circulation:   The intracranial vertebral arteries are patent. The basilar artery is patent. The posterior cerebral arteries are patent. Sites of moderate stenosis within the P1 and P2 right PCA segments. Moderate/severe stenosis within the left PCA at the P1/P2 junction (series 11, image 21). Additional bilateral PCA distal branch atherosclerotic irregularity. Posterior communicating arteries are hypoplastic or absent bilaterally.   Venous sinuses: Within the limitations of contrast timing, no convincing thrombus.  MRI Brain WO IV Contrast Acute infarct left frontal parietal lobe. This primarily involves the precentral cortex as well as involvement of the left parietal lobe and small areas of infarct in the left occipital lobe.   Chronic encephalomalacia left frontal lobe with evidence of prior left frontal craniotomy.  Echocardiogram Complete  Ejection fraction 50 to 55%.  No wall motion abnormalities.  EEG : Breach artifact left frontal region.  No seizure activity.  Physical Examination   Constitutional: Pleasant elderly Caucasian male not in distress.  Calm, appropriate for condition  Cardiovascular: Normal RR Respiratory: No increased WOB   Mental status: AAOX4, following commands  Speech: Naming intact, able to repeat with some hesitancy, impaired fluency. + Dysarthria  Cranial nerves: EOMI, VFF, R FD, Tongue midline  Motor: Normal bulk and tone. No drift. Diminished fine finger movements on the right. Orbits left over right upper extremity.  Dlt Bic Tri FgS HF  KnF KnE PIF DoF  R 1 1 1  0 5 5 5 5 5   L 5 5 5 5  5  5 5 5 5   Sensory: Intact to light touch throughout  Coordination: Intact FNF LUE  Gait: Deferred    Assessment and Plan   Benjamin Rowland is a 80 y.o. 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 He was within the time window to receive IVTPA thus received it. Not a candidate for thrombectomy due to lack of LVO.   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 At this time, stroke work up is complete. His CTA Head and Neck was pertinent for moderate stenosis within the distal left M1 MCA. MRI Brain showed an acute left frontal parietal stroke. Stroke labs w/LDL 151, Hemoglobin A1C 5.7. Echocardiogram was pertinent for EF 50 to 55 %, LA normal in size. Stroke etiology is cryptogenic; atherosclerosis versus cardioembolic.  - Continue Aspirin 81 mg and Atorvastatin 80 mg for secondary stroke prevention  - H needs a 30 day cardiac event monitor at discharge  - At discharge will place an ambulatory referral to neurology for stroke follow up   CARDS #Hypertension  No documented history of hypertension, diagnosed this admission given SBP trends. Current SBP goal is < 180 and will slowly normalize pressure at this time given he is out of the permissive HTN window.  - Lisinopril 5 mg initiated 6/22, up titrate as needed  #Bradycardia  Noted to be bradycardic w/HR in the mid to upper 50s, asymptomatic  - If symptomatic will treat and consult cardiology   #Hyperlipidemia From a stroke prevention stand point, the LDL goal is < 70. His LDL is 151, thus he was started on Atorvastatin 80 mg HS this admission  - Continue Atorvastatin 80 mg HS   RESPIRATORY  Oxygenating well on RA, no acute issues at this time   ENDO  #Prediabetes  A1C this admission noted to be 5.7, prediabetic range he is to follow up with PCP outpatient for prediabetes  management   GI  No dysphagia due to stroke, passed for a regular diet. Bowel regimen in place with Senokot QD   RENAL  #AKI vs CKD  Cr noted to be 1.3 on CMP, unsure what baseline is. Are actively trending.  - Trend Cr and lytes, especially given Lisinopril was started on 6/22 - Gentle hydration as necessary, currently on NaCI 75 ml/hr   HEME/ID  #Leukocytosis  Hemoglobin, hematocrit and platelet count stable. He has a mild leukocytosis with WBC at 12.2 are trending at this time  - FFWU if he fevers   DISPO  PT and OT are recommending SNF at this time. Waiting for facility to be selected by family.   Hospital day # 2  7/22, NP  [facility] Nurse Practitioner Patient seen and discussed with attending physician Dr. Stark Jock   I have personally obtained history,examined this patient, reviewed notes, independently viewed imaging studies, participated in medical decision making and plan of care.ROS completed by me personally and pertinent positives fully documented  I have made any additions or clarifications directly to the above note. Agree with note above.  Patient presented with sudden onset of expressive aphasia and right facial droop secondary to embolic left MCA infarct and received IV tPA and is obtained partial improvement.  Continue close neurological monitoring and strict blood pressure control as per post tPA protocol.  Mobilize out of bed.  Therapy consults.Recommend  loop recorder at discharge for paroxysmal A. Fib greater than 50% time during this 25-minute visit was spent in counseling and coordination of care about his embolic stroke and discussion with care team and answering questions.     Delia Heady, MD Medical Director [facility] Pager: [phone] 03/07/2021 10:30 AM   To contact Stroke Continuity provider, please refer to [email or web address]. After hours, contact General Neurology

## 2021-03-08 ENCOUNTER — Encounter (HOSPITAL_COMMUNITY)
Admission: EM | Disposition: A | Discharge status: Skilled Nursing Facility | Payer: Self-pay | Source: Other Acute Inpatient Hospital | Attending: Neurology

## 2021-03-08 ENCOUNTER — Encounter (HOSPITAL_COMMUNITY): Payer: Self-pay | Admitting: Cardiology

## 2021-03-08 DIAGNOSIS — I6389 Other cerebral infarction: Secondary | ICD-10-CM

## 2021-03-08 DIAGNOSIS — I63512 Cerebral infarction due to unspecified occlusion or stenosis of left middle cerebral artery: Principal | ICD-10-CM

## 2021-03-08 HISTORY — PX: LOOP RECORDER INSERTION: EP1214

## 2021-03-08 LAB — BASIC METABOLIC PANEL
Anion gap: 9 (ref 5–15)
BUN: 18 mg/dL (ref 8–23)
CO2: 23 mmol/L (ref 22–32)
Calcium: 9.5 mg/dL (ref 8.9–10.3)
Chloride: 104 mmol/L (ref 98–111)
Creatinine, Ser: 1.18 mg/dL (ref 0.61–1.24)
GFR, Estimated: >60 mL/min (ref >60)
Glucose, Bld: 107 mg/dL — ABNORMAL HIGH (ref 70–99)
Potassium: 3.8 mmol/L (ref 3.5–5.1)
Sodium: 136 mmol/L (ref 135–145)

## 2021-03-08 LAB — TROPONIN I (HIGH SENSITIVITY): Troponin I (High Sensitivity): 107 ng/L (ref <18)

## 2021-03-08 SURGERY — LOOP RECORDER INSERTION

## 2021-03-08 MED ORDER — LIDOCAINE-EPINEPHRINE 1 %-1:100000 IJ SOLN
INTRAMUSCULAR | Status: DC | PRN
  Administered 2021-03-08: 5 mL

## 2021-03-08 MED ORDER — LISINOPRIL 5 MG PO TABS: 5.0000 mg | ORAL_TABLET | Freq: Every day | ORAL | 0 refills | Status: DC

## 2021-03-08 MED ORDER — LIDOCAINE-EPINEPHRINE 1 %-1:100000 IJ SOLN
INTRAMUSCULAR | Status: AC
  Filled 2021-03-08: qty 1

## 2021-03-08 MED ORDER — PANTOPRAZOLE SODIUM 40 MG PO TBEC: 40.0000 mg | DELAYED_RELEASE_TABLET | Freq: Every day | ORAL | 0 refills | Status: DC

## 2021-03-08 MED ORDER — ATORVASTATIN CALCIUM 80 MG PO TABS: 80.0000 mg | ORAL_TABLET | Freq: Every day | ORAL | 0 refills | Status: AC

## 2021-03-08 MED ORDER — ACETAMINOPHEN 325 MG PO TABS: 650.0000 mg | ORAL_TABLET | ORAL | Status: AC | PRN

## 2021-03-08 MED ORDER — ASPIRIN 81 MG PO CHEW: 81.0000 mg | CHEWABLE_TABLET | Freq: Every day | ORAL | Status: AC

## 2021-03-08 SURGICAL SUPPLY — 2 items
MONITOR MOBILE MNGR LINQ22 (Prosthesis & Implant Heart) ×1 IMPLANT
PACK LOOP INSERTION (CUSTOM PROCEDURE TRAY) ×3 IMPLANT

## 2021-03-08 NOTE — Consult Note (Signed)
ELECTROPHYSIOLOGY CONSULT NOTE  Patient ID: Benjamin Rowland MRN: [id number], DOB/AGE: 1941/07/09   Admit date: 03/05/2021 Date of Consult: 03/08/2021  Primary Physician: Kaleen Mask, MD Primary Cardiologist: none Reason for Consultation: Cryptogenic stroke ; recommendations regarding Implantable Loop Recorder, requested by Dr. Pearlean Brownie  History of Present Illness Benjamin Rowland was admitted on 03/05/2021 with R arm weakness and apasia > stroke.    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  Stroke etiology is cryptogenic; atherosclerosis versus cardioembolic  he has undergone workup for stroke including echocardiogram and carotid angio.  The patient has been monitored on telemetry which has demonstrated sinus rhythm with no arrhythmias.    Neurology has deferred TEE   Echocardiogram this admission demonstrated.   IMPRESSIONS   1. Left ventricular ejection fraction, by estimation, is 50 to 55%. The  left ventricle has low normal function. The left ventricle has no regional  wall motion abnormalities. There is mild left ventricular hypertrophy.  Left ventricular diastolic  parameters are consistent with Grade I diastolic dysfunction (impaired  relaxation).   2. Right ventricular systolic function is normal. The right ventricular  size is normal.   3. The mitral valve is normal in structure. Trivial mitral valve  regurgitation. No evidence of mitral stenosis. Severe mitral annular  calcification.   4. The aortic valve is tricuspid. Aortic valve regurgitation is trivial.  Mild aortic valve sclerosis is present, with no evidence of aortic valve  stenosis.   Lab work is reviewed. WBC 12, afebrile, no symptoms of illness   Prior to admission, the patient denies chest pain, shortness of breath, dizziness, positive for  occ palpitations, no syncope.  Plans to SNF at discharge.    Surgical History: No past surgical history on file.   Medications Prior to Admission  Medication Sig Dispense Refill Last Dose   Aspirin-Salicylamide-Caffeine (BC HEADACHE POWDER PO) Take 1 packet by mouth as needed (headaches).   unknown   Cholecalciferol (VITAMIN D-3 PO) Take 1 tablet by mouth daily.   unknown   meloxicam (MOBIC) 7.5 MG tablet Take 1 tablet (7.5 mg total) by mouth 2 (two) times daily as needed for pain. (Patient not taking: No sig reported) 30 tablet 2 Not Taking    Inpatient Medications:   aspirin  81 mg Oral Daily   atorvastatin  80 mg Oral QHS   Chlorhexidine Gluconate Cloth  6 each Topical Q0600   lisinopril  5 mg Oral Daily   mouth rinse  15 mL Mouth Rinse BID   pantoprazole  40 mg Oral QHS    Allergies: No Known Allergies  Social History   Socioeconomic History   Marital status: Single    Spouse name: Not on file   Number of children: Not on file   Years of education: Not on file   Highest education level: Not on file  Occupational History   Not on file  Tobacco Use   Smoking status: Never   Smokeless tobacco: Never  Substance and Sexual Activity   Alcohol use: Not on file   Drug use: Not on file   Sexual activity: Not on file  Other Topics Concern   Not on file  Social History Narrative   Not on file   Social Determinants of Health   Financial Resource Strain: Not on file  Food Insecurity: Not on file  Transportation Needs: Not on file  Physical Activity: Not on file  Stress: Not on file  Social Connections: Not on file  Intimate Partner Violence: Not on file     No family history: denies any known cardiac family hx    Review of Systems: All other systems reviewed and are otherwise negative except as noted above.  Physical Exam: Vitals:   03/07/21 2000 03/08/21 0000 03/08/21 0339 03/08/21 0740  BP: (!) 158/78 (!) 161/75 (!) 164/79 (!) 157/84  Pulse: 88 76 79 80   Resp: 18 18 16 18   Temp: 99.7 F (37.6 C) 99.4 F (37.4 C) 99.7 F (37.6 C) 97.8 F (36.6 C)  TempSrc: Temporal Temporal Temporal Oral  SpO2: 99%  98% 97%  Weight:      Height:        GEN- The patient is well appearing, alert and oriented x 3 today, chrinically ill appearing   Head- normocephalic, atraumatic Eyes-  Sclera clear, conjunctiva pink Ears- hearing intact Oropharynx- clear Neck- supple Lungs- CTA b/l, normal work of breathing Heart- RRR, no murmurs, rubs or gallops  GI- soft, NT, ND Extremities- no clubbing, cyanosis, or edema MS- no significant deformity or atrophy Skin- no rash or lesion Psych- euthymic mood, full affect   Labs:   Lab Results  Component Value Date   WBC 12.2 (H) 03/07/2021   HGB 15.8 03/07/2021   HCT 47.7 03/07/2021   MCV 92.4 03/07/2021   PLT 250 03/07/2021    Recent Labs  Lab 03/05/21 1200 03/05/21 1206 03/08/21 0246  NA 138   < > 136  K 3.9   < > 3.8  CL 106   < > 104  CO2 25   < > 23  BUN 18   < > 18  CREATININE 1.30*   < > 1.18  CALCIUM 9.3   < > 9.5  PROT 6.5  --   --   BILITOT 0.7  --   --   ALKPHOS 82  --   --   ALT 17  --   --   AST 29  --   --   GLUCOSE 117*   < > 107*   < > = values in this interval not displayed.   No results found for: CKTOTAL, CKMB, CKMBINDEX, TROPONINI Lab Results  Component Value Date   CHOL 206 (H) 03/06/2021   Lab Results  Component Value Date   HDL 50 03/06/2021   Lab Results  Component Value Date   LDLCALC 151 (H) 03/06/2021   Lab Results  Component Value Date   TRIG 26 03/06/2021   Lab Results  Component Value Date   CHOLHDL 4.1 03/06/2021   No results found for: LDLDIRECT  No results found for: DDIMER   Radiology/Studies:  CT HEAD WO CONTRAST Result Date: 03/06/2021 CLINICAL DATA:  Stroke follow-up.  Aphasia/slurred speech EXAM: CT HEAD WITHOUT CONTRAST TECHNIQUE: Contiguous axial images were obtained from the base of the skull through the vertex without  intravenous contrast. COMPARISON:  Yesterday FINDINGS: Brain: Cytotoxic edema has become apparent in the left frontal parietal cortex. No acute hemorrhage, hydrocephalus, or collection. Dense encephalomalacia in the left frontal lobe where there has been prior craniotomy. Parenchymal calcification and superficial metallic densities are present at the level of encephalomalacia. Vascular: No hyperdense vessel or unexpected calcification. Skull: Remote left craniotomy. Sinuses/Orbits: Negative IMPRESSION: Acute infarct has become apparent in the left frontal parietal cortex. No acute hemorrhage. Electronically Signed   By: 03/08/2021  Watts M.D.   On: 03/06/2021 07:04   MR BRAIN WO CONTRAST Result Date: 03/06/2021 CLINICAL DATA:  Stroke.  Right-sided weakness EXAM: MRI HEAD WITHOUT CONTRAST TECHNIQUE: Multiplanar, multiecho pulse sequences of the brain and surrounding structures were obtained without intravenous contrast. COMPARISON:  CT head 03/06/2021 FINDINGS: Brain: Acute infarct in the left frontal parietal lobe with restricted diffusion. This involves primarily the precentral cortex with scattered small areas in the postcentral cortex well as in the left occipital lobe. Solitary chronic microhemorrhage left parietal cortex likely is unrelated to the acute infarct. Chronic encephalomalacia left frontal lobe with prior left craniotomy. Dural clips are present in the left frontal lobe. Ventricle size normal. No mass lesion. Patchy white matter hyperintensity bilaterally compatible with mild chronic microvascular ischemia. Vascular: Normal arterial flow voids Skull and upper cervical spine: Left frontal craniotomy. Sinuses/Orbits: Mild mucosal edema paranasal sinuses. Negative orbit Other: None IMPRESSION: Acute infarct left frontal parietal lobe. This primarily involves the precentral cortex as well as involvement of the left parietal lobe and small areas of infarct in the left occipital lobe. Chronic  encephalomalacia left frontal lobe with evidence of prior left frontal craniotomy. Electronically Signed   By: Marlan Palau M.D.   On: 03/06/2021 11:32    CT CEREBRAL PERFUSION W CONTRAST Result Date: 03/05/2021 CLINICAL DATA:  Neuro deficit, acute, stroke suspected. EXAM: CT ANGIOGRAPHY HEAD AND NECK CT PERFUSION BRAIN TECHNIQUE: Multidetector CT imaging of the head and neck was performed using the standard protocol during bolus administration of intravenous contrast. Multiplanar CT image reconstructions and MIPs were obtained to evaluate the vascular anatomy. Carotid stenosis measurements (when applicable) are obtained utilizing NASCET criteria, using the distal internal carotid diameter as the denominator. Multiphase CT imaging of the brain was performed following IV bolus contrast injection. Subsequent parametric perfusion maps were calculated using RAPID software. CONTRAST:  46mL OMNIPAQUE IOHEXOL 350 MG/ML SOLN; 65mL OMNIPAQUE IOHEXOL 350 MG/ML SOLN COMPARISON:  Noncontrast head CT performed earlier today 03/05/2021. FINDINGS: CTA NECK FINDINGS Aortic arch: Standard aortic branching. Atherosclerotic plaque within the visualized aortic arch and proximal major branch vessels of the neck. No hemodynamically significant innominate or proximal subclavian artery stenosis. Right carotid system: CCA and ICA patent within the neck without stenosis. Minimal calcified plaque within the carotid bifurcation. Tortuosity of the distal cervical ICA. Left carotid system: CCA and ICA patent within the neck without stenosis. Minimal soft and calcified plaque within the proximal ICA. Vertebral arteries: Patent within the neck without hemodynamically significant stenosis (50% or greater). Mild soft and calcified plaque at the origin of the dominant left vertebral artery. Skeleton: No acute bony abnormality or aggressive osseous lesion. 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 Cervical spondylosis. Other neck: No neck mass  or cervical lymphadenopathy. Upper chest: No consolidation within the imaged lung apices. Review of the MIP images confirms the above findings CTA HEAD FINDINGS Anterior circulation: The intracranial internal carotid arteries are patent. Atherosclerotic plaque within both vessels with no more than mild stenosis. The M1 middle cerebral arteries are patent. Moderate stenosis within the distal left M1 MCA segment (series 9, image 16). Atherosclerotic irregularity of the M2 and more distal middle cerebral artery branches bilaterally. However, no M2 proximal branch occlusion or high-grade proximal M2 stenosis is identified. The A1 right anterior cerebral artery is developmentally absent or markedly hypoplastic. The anterior cerebral arteries are otherwise patent. 1 mm laterally projecting vascular protrusion arising from the cavernous left ICA, which may reflect a tiny aneurysm (series 6, image 110). Posterior  circulation: The intracranial vertebral arteries are patent. The basilar artery is patent. The posterior cerebral arteries are patent. Sites of moderate stenosis within the P1 and P2 right PCA segments. Moderate/severe stenosis within the left PCA at the P1/P2 junction (series 11, image 21). Additional bilateral PCA distal branch atherosclerotic irregularity. Posterior communicating arteries are hypoplastic or absent bilaterally. Venous sinuses: Within the limitations of contrast timing, no convincing thrombus. Anatomic variants: As described Review of the MIP images confirms the above findings CT Brain Perfusion Findings: ASPECTS: 10 CBF (<30%) Volume: 0mL Perfusion (Tmax>6.0s) volume: 0mL Mismatch Volume: 0mL Infarction Location:None identified. No intracranial large vessel occlusion identified. These results were called by telephone at the time of interpretation on 03/05/2021 at 12:58 pm to provider [facility] , who verbally acknowledged these results. IMPRESSION: CTA neck: The common carotid, internal  carotid and vertebral arteries are patent within the neck without hemodynamically significant stenosis. Mild atherosclerotic disease, as described. CTA head: 1. No intracranial large vessel occlusion identified. 2. Intracranial atherosclerotic disease with multifocal stenoses, most notably as follows. 3. Moderate stenosis within the distal M1 left MCA. 4. Moderate/severe stenosis within the P2 left PCA. 5. Sites of moderate stenosis within the P1 and P2 segments of the right posterior cerebral artery. 6. 1 mm laterally projecting vascular protrusion arising from the cavernous left ICA, which may reflect a tiny aneurysm. CT perfusion head: The perfusion software identifies no core infarct. The perfusion software identifies no region of critically hypoperfused parenchyma utilizing the Tmax>6 seconds threshold. No mismatch reported. Electronically Signed   By: Jackey Loge DO   On: 03/05/2021 13:01    EEG adult Result Date: 03/06/2021 Charlsie Quest, MD     03/06/2021  3:56 PM Patient Name: Benjamin Rowland MRN: [id number] Epilepsy Attending: Charlsie Quest Referring Physician/Provider: Toni Amend heard, NP Date: 03/06/2021 Duration: 24.23 minutes Patient history: 80 year old male with history of seizures status post cranial and left frontal resection presented with right-sided weakness.  EEG to evaluate for seizures. Level of alertness: Awake, asleep AEDs during EEG study: None Technical aspects: This EEG study was done with scalp electrodes positioned according to the 10-20 International system of electrode placement. Electrical activity was acquired at a sampling rate of  and reviewed with a high frequency filter of  and a low frequency filter of . EEG data were recorded continuously and digitally stored. Description: The posterior dominant rhythm consists of 8 Hz activity of moderate voltage (25-35 uV) seen predominantly in posterior head regions, symmetric and reactive to eye opening and eye closing.  Sleep was characterized by vertex waves, sleep spindles (12 to 14 Hz), maximal frontocentral region.  EEG showed continuous 3 to 5 Hz sharply contoured 3 to 5 Hz theta-delta slowing with overriding 13 to 18 Hz beta activity left frontal region consistent with breach artifact.  Hyperventilation and photic stimulation were not performed.   ABNORMALITY -Breach artifact, left frontal region IMPRESSION: This study is suggestive of cortical dysfunction in left frontal region consistent with prior craniotomy.  No seizures or definite epileptiform discharges were seen throughout the recording. Priyanka O Yadav     12-lead ECG SR All prior EKG's in EPIC reviewed with no documented atrial fibrillation  Telemetry SR, occ and at times more frequent PVCs, rare couple 3beat NSVT, rare blocked PACs No AF  Assessment and Plan:  1. Cryptogenic stroke The patient presents with cryptogenic stroke.  I and Dr. Lalla Brothers have spoken at length with the patient about monitoring for afib with either a  30 day event monitor or an implantable loop recorder.  Risks, benefits, and alteratives to implantable loop recorder were discussed with the patient today.   At this time, the patient is very clear in their decision to proceed with implantable loop recorder.   Wound care was reviewed with the patient (keep incision clean and dry for 3 days).  Wound check follow up will be scheduled for the patient.  Please call with questions.   Nyx Keady Norberto SorensonLynn Keyly Baldonado, PA-C 03/08/2021

## 2021-03-08 NOTE — Progress Notes (Signed)
Handoff report given to receiving facility, reported to Sharlyne Pacas, RN.  PTAR at bedside for patient.

## 2021-03-08 NOTE — Progress Notes (Signed)
This chaplain responded to Dr. Pearlean Brownie consult for creating or updating the Pt. Advance Directive.  The Pt. participated in Advance Directive education with the chaplain and indicated his choices for HCPOA and Living Will.  The AD was filled out with the Pt. and placed in the Pt. personal belongings bag inside the wardrobe.  A notary is not available for notarizing the Advance Directive in the hospital today.    The Pt. identified Benjamin Rowland as his healthcare agent and Benjamin Rowland as the person if the healthcare agent is unable or unwilling to serve in the role as healthcare agent.  The Pt. and chaplain discussed opportunities for a notary outside the hospital.  The Pt. is hopeful a notary will be available at [facility].   The chaplain understands the Pt. is interested in the MD discussing the completion of a DNR and MOST form.   This chaplain is available for F/U spiritual care as needed.

## 2021-03-08 NOTE — TOC Transition Note (Addendum)
Transition of Care [facility]) - CM/SW Discharge Note   Patient Details  Name: Benjamin Rowland MRN: [id number] Date of Birth: 04-14-1941  [facility] CM/SW Contact:  Baldemar Lenis, LCSW Phone Number: 03/08/2021, 2:57 PM   Clinical Narrative:   Nurse to call report to [phone], Room 204    Final next level of care: Skilled Nursing Facility Barriers to Discharge: Barriers Resolved   Patient Goals and CMS Choice   CMS Medicare.gov Compare Post Acute Care list provided to:: Patient Choice offered to / list presented to : Patient (cousin: [name])  Discharge Placement              Patient chooses bed at: [facility], [place] Patient to be transferred to facility by: PTAR Name of family member notified: Self, [name] Patient and family notified of of transfer: 03/08/21  Discharge Plan and Services In-house Referral: Clinical Social Work Discharge Planning Services: [employer] Consult Post Acute Care Choice: Skilled Nursing Facility                               Social Determinants of Health (SDOH) Interventions     Readmission Risk Interventions No flowsheet data found.

## 2021-03-08 NOTE — Discharge Summary (Addendum)
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   aspirin 81 MG chewable tablet Chew 1 tablet (81 mg total) by mouth daily. Start taking on: March 09, 2021   atorvastatin 80 MG tablet Commonly known as: LIPITOR Take 1 tablet (80 mg total) by mouth at bedtime.   lisinopril 5 MG tablet Commonly known as: ZESTRIL Take 1 tablet (5 mg total) by mouth daily. Start taking on: March 09, 2021   pantoprazole 40 MG tablet Commonly known as: PROTONIX Take 1 tablet (40 mg total) by mouth at bedtime.   VITAMIN D-3 PO Take 1 tablet by mouth daily.        LABORATORY STUDIES CBC    Component Value Date/Time   WBC 12.2 (H) 03/07/2021 0901   RBC 5.16 03/07/2021 0901   HGB 15.8 03/07/2021 0901   HCT 47.7 03/07/2021 0901   PLT 250 03/07/2021 0901   MCV 92.4 03/07/2021 0901   MCH 30.6 03/07/2021 0901   MCHC 33.1 03/07/2021 0901   RDW 14.3 03/07/2021 0901   LYMPHSABS 2.6 03/05/2021 1200   MONOABS 0.8 03/05/2021 1200   EOSABS 0.2 03/05/2021 1200   BASOSABS 0.0 03/05/2021 1200   CMP    Component Value Date/Time   NA 136 03/08/2021 0246   K 3.8 03/08/2021 0246   CL 104 03/08/2021 0246   CO2 23 03/08/2021 0246   GLUCOSE 107 (H) 03/08/2021 0246   BUN 18  03/08/2021 0246   CREATININE 1.18 03/08/2021 0246   CALCIUM 9.5 03/08/2021 0246   PROT 6.5 03/05/2021 1200   ALBUMIN 3.8 03/05/2021 1200   AST 29 03/05/2021 1200   ALT 17 03/05/2021 1200   ALKPHOS 82 03/05/2021 1200   BILITOT 0.7 03/05/2021 1200   GFRNONAA >60 03/08/2021 0246   COAGS Lab Results  Component Value Date   INR 1.0 03/05/2021   Lipid Panel    Component Value Date/Time   CHOL 206 (H) 03/06/2021 0210   TRIG 26 03/06/2021 0210   HDL 50 03/06/2021 0210   CHOLHDL 4.1 03/06/2021 0210   VLDL 5 03/06/2021 0210   LDLCALC 151 (H) 03/06/2021 0210   HgbA1C  Lab Results  Component Value Date   HGBA1C 5.7 (H) 03/06/2021   Urinalysis    Component Value Date/Time   COLORURINE STRAW (A) 03/05/2021 1202   APPEARANCEUR CLEAR 03/05/2021 1202   LABSPEC 1.021 03/05/2021 1202   PHURINE 6.0 03/05/2021 1202   GLUCOSEU NEGATIVE 03/05/2021 1202   HGBUR NEGATIVE 03/05/2021 1202   BILIRUBINUR NEGATIVE 03/05/2021 1202   KETONESUR NEGATIVE 03/05/2021 1202   PROTEINUR NEGATIVE 03/05/2021 1202   NITRITE NEGATIVE 03/05/2021 1202  LEUKOCYTESUR NEGATIVE 03/05/2021 1202   Urine Drug Screen     Component Value Date/Time   LABOPIA NONE DETECTED 03/05/2021 1202   COCAINSCRNUR NONE DETECTED 03/05/2021 1202   LABBENZ NONE DETECTED 03/05/2021 1202   AMPHETMU NONE DETECTED 03/05/2021 1202   THCU NONE DETECTED 03/05/2021 1202   LABBARB NONE DETECTED 03/05/2021 1202    Alcohol Level    Component Value Date/Time   ETH <10 03/05/2021 1200     SIGNIFICANT DIAGNOSTIC STUDIES CT HEAD WO CONTRAST  Result Date: 03/06/2021 CLINICAL DATA:  Stroke follow-up.  Aphasia/slurred speech EXAM: CT HEAD WITHOUT CONTRAST TECHNIQUE: Contiguous axial images were obtained from the base of the skull through the vertex without intravenous contrast. COMPARISON:  Yesterday FINDINGS: Brain: Cytotoxic edema has become apparent in the left frontal parietal cortex. No acute hemorrhage, hydrocephalus, or  collection. Dense encephalomalacia in the left frontal lobe where there has been prior craniotomy. Parenchymal calcification and superficial metallic densities are present at the level of encephalomalacia. Vascular: No hyperdense vessel or unexpected calcification. Skull: Remote left craniotomy. Sinuses/Orbits: Negative IMPRESSION: Acute infarct has become apparent in the left frontal parietal cortex. No acute hemorrhage. Electronically Signed   By: Marnee Spring M.D.   On: 03/06/2021 07:04   CT HEAD WO CONTRAST  Result Date: 03/05/2021 CLINICAL DATA:  Cerebral hemorrhage suspected. Post tPA possible bleed. EXAM: CT HEAD WITHOUT CONTRAST TECHNIQUE: Contiguous axial images were obtained from the base of the skull through the vertex without intravenous contrast. COMPARISON:  Noncontrast head CT, CT angiogram head/neck and CT perfusion head performed earlier today 03/05/2021. FINDINGS: Brain: Mildly motion degraded exam. There is residual circulating intravascular contrast from the CTA performed earlier today. Cerebral volume is normal for age. Redemonstrated chronic focus of encephalomalacia and gliosis within the anterior left frontal lobe. Redemonstrated subcentimeter focus of parenchymal calcification at this site. Mild patchy and ill-defined hypoattenuation within the cerebral white matter is nonspecific, but compatible with chronic small vessel ischemic disease. There is no acute intracranial hemorrhage. No demarcated cortical infarct. No extra-axial fluid collection. No evidence of intracranial mass. No midline shift. Vascular: No hyperdense vessel.  Atherosclerotic calcifications. Skull: Normal. Negative for fracture or focal lesion. Sinuses/Orbits: Visualized orbits show no acute finding. Small mucous retention cysts within the bilateral maxillary sinuses. Background trace left maxillary sinus mucosal thickening. Trace bilateral ethmoid sinus mucosal thickening. These results were called by telephone at  the time of interpretation on 03/05/2021 at 2:09 pm to provider Benjamin Rowland , who verbally acknowledged these results. IMPRESSION: No evidence of acute intracranial hemorrhage status post tPA. No appreciable acute demarcated cortical infarct. Redemonstrated chronic focus of encephalomalacia and gliosis within the anterior left frontal lobe (deep to a left-sided cranioplasty). Unchanged subcentimeter focus of associated parenchymal calcification at this site. 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 Electronically Signed   By: Jackey Loge DO   On: 03/05/2021 14:15   MR BRAIN WO CONTRAST  Result Date: 03/06/2021 CLINICAL DATA:  Stroke.  Right-sided weakness EXAM: MRI HEAD WITHOUT CONTRAST TECHNIQUE: Multiplanar, multiecho pulse sequences of the brain and surrounding structures were obtained without intravenous contrast. COMPARISON:  CT head 03/06/2021 FINDINGS: Brain: Acute infarct in the left frontal parietal lobe with restricted diffusion. This involves primarily the precentral cortex with scattered small areas in the postcentral cortex well as in the left occipital lobe. Solitary chronic microhemorrhage left parietal cortex likely is unrelated to the acute infarct. Chronic encephalomalacia left frontal lobe with prior left craniotomy. Dural clips are  present in the left frontal lobe. Ventricle size normal. No mass lesion. Patchy white matter hyperintensity bilaterally compatible with mild chronic microvascular ischemia. Vascular: Normal arterial flow voids Skull and upper cervical spine: Left frontal craniotomy. Sinuses/Orbits: Mild mucosal edema paranasal sinuses. Negative orbit Other: None IMPRESSION: Acute infarct left frontal parietal lobe. This primarily involves the precentral cortex as well as involvement of the left parietal lobe and small areas of infarct in the left occipital lobe. Chronic encephalomalacia left frontal lobe with evidence of prior left frontal  craniotomy. Electronically Signed   By: Marlan Palau M.D.   On: 03/06/2021 11:32   CT CEREBRAL PERFUSION W CONTRAST  Result Date: 03/05/2021 CLINICAL DATA:  Neuro deficit, acute, stroke suspected. EXAM: CT ANGIOGRAPHY HEAD AND NECK CT PERFUSION BRAIN TECHNIQUE: Multidetector CT imaging of the head and neck was performed using the standard protocol during bolus administration of intravenous contrast. Multiplanar CT image reconstructions and MIPs were obtained to evaluate the vascular anatomy. Carotid stenosis measurements (when applicable) are obtained utilizing NASCET criteria, using the distal internal carotid diameter as the denominator. Multiphase CT imaging of the brain was performed following IV bolus contrast injection. Subsequent parametric perfusion maps were calculated using RAPID software. CONTRAST:  40mL OMNIPAQUE IOHEXOL 350 MG/ML SOLN; 75mL OMNIPAQUE IOHEXOL 350 MG/ML SOLN COMPARISON:  Noncontrast head CT performed earlier today 03/05/2021. FINDINGS: CTA NECK FINDINGS Aortic arch: Standard aortic branching. Atherosclerotic plaque within the visualized aortic arch and proximal major branch vessels of the neck. No hemodynamically significant innominate or proximal subclavian artery stenosis. Right carotid system: CCA and ICA patent within the neck without stenosis. Minimal calcified plaque within the carotid bifurcation. Tortuosity of the distal cervical ICA. Left carotid system: CCA and ICA patent within the neck without stenosis. Minimal soft and calcified plaque within the proximal ICA. Vertebral arteries: Patent within the neck without hemodynamically significant stenosis (50% or greater). Mild soft and calcified plaque at the origin of the dominant left vertebral artery. Skeleton: No acute bony abnormality or aggressive osseous lesion. 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 Cervical spondylosis. Other neck: No neck mass or cervical lymphadenopathy. Upper chest: No consolidation within the  imaged lung apices. Review of the MIP images confirms the above findings CTA HEAD FINDINGS Anterior circulation: The intracranial internal carotid arteries are patent. Atherosclerotic plaque within both vessels with no more than mild stenosis. The M1 middle cerebral arteries are patent. Moderate stenosis within the distal left M1 MCA segment (series 9, image 16). Atherosclerotic irregularity of the M2 and more distal middle cerebral artery branches bilaterally. However, no M2 proximal branch occlusion or high-grade proximal M2 stenosis is identified. The A1 right anterior cerebral artery is developmentally absent or markedly hypoplastic. The anterior cerebral arteries are otherwise patent. 1 mm laterally projecting vascular protrusion arising from the cavernous left ICA, which may reflect a tiny aneurysm (series 6, image 110). Posterior circulation: The intracranial vertebral arteries are patent. The basilar artery is patent. The posterior cerebral arteries are patent. Sites of moderate stenosis within the P1 and P2 right PCA segments. Moderate/severe stenosis within the left PCA at the P1/P2 junction (series 11, image 21). Additional bilateral PCA distal branch atherosclerotic irregularity. Posterior communicating arteries are hypoplastic or absent bilaterally. Venous sinuses: Within the limitations of contrast timing, no convincing thrombus. Anatomic variants: As described Review of the MIP images confirms the above findings CT Brain Perfusion Findings: ASPECTS: 10 CBF (<30%) Volume: 0mL Perfusion (Tmax>6.0s) volume: 0mL Mismatch Volume: 0mL Infarction Location:None identified. No intracranial large vessel occlusion  identified. These results were called by telephone at the time of interpretation on 03/05/2021 at 12:58 pm to provider [facility] , who verbally acknowledged these results. IMPRESSION: CTA neck: The common carotid, internal carotid and vertebral arteries are patent within the neck without  hemodynamically significant stenosis. Mild atherosclerotic disease, as described. CTA head: 1. No intracranial large vessel occlusion identified. 2. Intracranial atherosclerotic disease with multifocal stenoses, most notably as follows. 3. Moderate stenosis within the distal M1 left MCA. 4. Moderate/severe stenosis within the P2 left PCA. 5. Sites of moderate stenosis within the P1 and P2 segments of the right posterior cerebral artery. 6. 1 mm laterally projecting vascular protrusion arising from the cavernous left ICA, which may reflect a tiny aneurysm. CT perfusion head: The perfusion software identifies no core infarct. The perfusion software identifies no region of critically hypoperfused parenchyma utilizing the Tmax>6 seconds threshold. No mismatch reported. 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  EEG to evaluate for seizures. Level of alertness: Awake, asleep AEDs during EEG study: None Technical aspects: This EEG study was done with scalp electrodes positioned according to the 10-20 International system of electrode placement. Electrical activity was acquired at a sampling rate of 500Hz  and reviewed with a high frequency filter of 70Hz  and a low frequency filter of 1Hz . EEG data were recorded continuously and digitally stored. Description: The posterior dominant rhythm consists of 8 Hz activity of moderate voltage (25-35 uV) seen predominantly in posterior head regions, symmetric and reactive to eye opening and eye closing. Sleep was characterized by vertex waves, sleep spindles (12 to 14  Hz), maximal frontocentral region.  EEG showed continuous 3 to 5 Hz sharply contoured 3 to 5 Hz theta-delta slowing with overriding 13 to 18 Hz beta activity left frontal region consistent with breach artifact.  Hyperventilation and photic stimulation were not performed.   ABNORMALITY -Breach artifact, left frontal region IMPRESSION: This study is suggestive of cortical dysfunction in left frontal region consistent with prior craniotomy.  No seizures or definite epileptiform discharges were seen throughout the recording.   ECHOCARDIOGRAM COMPLETE  Result Date: 03/06/2021    ECHOCARDIOGRAM REPORT   Patient Name:   Benjamin Rowland Date of Exam: 03/06/2021 Medical Rec #:  03/08/2021    Height:       71.0 in Accession #:    Benjamin Bouche   Weight:       194.4 lb Date of Birth:  1941/02/20    BSA:          2.083 m Patient Age:    80 years     BP:           146/69 mmHg Patient Gender: M            HR:           60 bpm. Exam Location:  Inpatient Procedure: 2D Echo, Color Doppler and Cardiac Doppler Indications:    Stroke i63.9  History:        Patient has no prior history of Echocardiogram examinations.  Sonographer:    [id number] Senior RDCS Referring Phys: 02/25/1941 [facility]  Sonographer Comments: Technically difficult due to poor echo windows; scanned supine due to patient condition. IMPRESSIONS  1. Left ventricular ejection fraction, by estimation, is 50 to 55%. The left ventricle has low normal function. The left ventricle has no regional wall motion abnormalities. There is mild left ventricular hypertrophy. Left ventricular diastolic parameters are consistent with Grade I diastolic dysfunction (impaired relaxation).  2. Right ventricular systolic function is normal. The right ventricular size is normal.  3. The mitral valve is normal in structure. Trivial mitral valve regurgitation. No evidence of mitral stenosis. Severe mitral annular calcification.  4. The aortic valve is tricuspid. Aortic valve  regurgitation is trivial. Mild aortic valve sclerosis is present, with no evidence of aortic valve stenosis. FINDINGS  Left Ventricle: Left ventricular ejection fraction, by estimation, is 50 to 55%. The left ventricle has low normal function. The left ventricle has no regional wall motion abnormalities. The left ventricular internal cavity size was normal in size. There is mild left ventricular hypertrophy. Left ventricular diastolic parameters are consistent with Grade I diastolic dysfunction (impaired relaxation). Right Ventricle: The right ventricular size is normal.Right ventricular systolic function is normal. Left Atrium: Left atrial size was normal in size. Right Atrium: Right atrial size was normal in size. Pericardium: There is no evidence of pericardial effusion. Mitral Valve: The mitral valve is normal in structure. Severe mitral annular calcification. Trivial mitral valve regurgitation. No evidence of mitral valve stenosis. Tricuspid Valve: The tricuspid valve is normal in structure. Tricuspid valve regurgitation is trivial. No evidence of tricuspid stenosis. Aortic Valve: The aortic valve is tricuspid. Aortic valve regurgitation is trivial. Mild aortic valve sclerosis is present, with no evidence of aortic valve stenosis. Pulmonic Valve: The pulmonic valve was not well visualized. Pulmonic valve regurgitation is not visualized. No evidence of pulmonic stenosis. Aorta: The aortic root is normal in size and structure. Venous: The inferior vena cava was not well visualized. IAS/Shunts: The interatrial septum was not well visualized.  LEFT VENTRICLE PLAX 2D LVIDd:         5.20 cm  Diastology LVIDs:         3.50 cm  LV e' medial:    4.03 cm/s LV PW:         0.90 cm  LV E/e' medial:  12.0 LV IVS:        1.40 cm  LV e' lateral:   4.90 cm/s LVOT diam:     2.00 cm  LV E/e' lateral: 9.9 LV SV:         96 LV SV Index:   46 LVOT Area:     3.14 cm  RIGHT VENTRICLE RV S prime:     15.20 cm/s TAPSE (M-mode): 2.0 cm  LEFT ATRIUM             Index       RIGHT ATRIUM           Index LA diam:        4.70 cm 2.26 cm/m  RA Area:     13.80 cm LA Vol (A2C):   48.2 ml 23.13 ml/m RA Volume:   30.60 ml  14.69 ml/m LA Vol (A4C):   63.2 ml 30.33 ml/m LA Biplane Vol: 56.3 ml 27.02 ml/m  AORTIC VALVE LVOT Vmax:   164.00 cm/s LVOT Vmean:  114.000 cm/s LVOT VTI:    0.306 m  AORTA Ao Root diam: 3.20 cm Ao Asc diam:  3.70 cm MITRAL VALVE MV Area (PHT): 2.00 cm  SHUNTS MV Decel Time: 379 msec    Systemic VTI:  0.31 m MV E velocity: 48.40 cm/s  Systemic Diam: 2.00 cm MV A velocity: 97.30 cm/s MV E/A ratio:  0.50 Olga Millers MD Electronically signed by Olga Millers MD Signature Date/Time: 03/06/2021/3:29:54 PM    Final    CT HEAD CODE STROKE WO CONTRAST  Result Date: 03/05/2021 CLINICAL DATA:  Code stroke. Neuro deficit, acute, stroke suspected. Additional history provided by neurology: Right-sided deficits. EXAM: CT HEAD WITHOUT CONTRAST TECHNIQUE: Contiguous axial images were obtained from the base of the skull through the vertex without intravenous contrast. COMPARISON:  No pertinent prior exams available for comparison. FINDINGS: Brain: Cerebral volume is normal for age. Chronic encephalomalacia and gliosis within the anterior left frontal lobe, deep to a cranioplasty. There is a small focus of calcification within this region of encephalomalacia. Mild patchy and ill-defined hypoattenuation within the cerebral white matter, nonspecific but compatible with chronic small vessel ischemic disease. There is no acute intracranial hemorrhage. No demarcated cortical infarct. No extra-axial fluid collection. No evidence of intracranial mass. No midline shift. Vascular: No hyperdense vessel.  Atherosclerotic calcifications. Skull: Left sided cranioplasty.  No calvarial fracture. Sinuses/Orbits: Visualized orbits show no acute finding. Small mucous retention cysts within the bilateral maxillary sinuses, and trace left maxillary sinus  mucosal thickening, at the imaged levels. Trace bilateral ethmoid sinus mucosal thickening. ASPECTS ([place] Stroke Program Early CT Score) - Ganglionic level infarction (caudate, lentiform nuclei, internal capsule, insula, M1-M3 cortex): 7 - Supraganglionic infarction (M4-M6 cortex): 3 Total score (0-10 with 10 being normal): 10 These results were called by telephone at the time of interpretation on 03/05/2021 at 12:25 pm to provider Dr. Derry Lory, who verbally acknowledged these results. IMPRESSION: No evidence of acute intracranial abnormality. Focus of chronic encephalomalacia/gliosis within the anterior left frontal lobe, deep to a left-sided cranioplasty. A small focus of parenchymal calcification is also present at this site. 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 Mild paranasal sinus disease at the imaged levels, as described. Electronically Signed   By: Jackey Loge DO   On: 03/05/2021 12:38   CT ANGIO HEAD NECK W WO CM (CODE STROKE)  Result Date: 03/05/2021 CLINICAL DATA:  Neuro deficit, acute, stroke suspected. EXAM: CT ANGIOGRAPHY HEAD AND NECK CT PERFUSION BRAIN TECHNIQUE: Multidetector CT imaging of the head and neck was performed using the standard protocol during bolus administration of intravenous contrast. Multiplanar CT image reconstructions and MIPs were obtained to evaluate the vascular anatomy. Carotid stenosis measurements (when applicable) are obtained utilizing NASCET criteria, using the distal internal carotid diameter as the denominator. Multiphase CT imaging of the brain was performed following IV bolus contrast injection. Subsequent parametric perfusion maps were calculated using RAPID software. CONTRAST:  40mL OMNIPAQUE IOHEXOL 350 MG/ML SOLN; 75mL OMNIPAQUE IOHEXOL 350 MG/ML SOLN COMPARISON:  Noncontrast head CT performed earlier today 03/05/2021. FINDINGS: CTA NECK FINDINGS Aortic arch: Standard aortic branching. Atherosclerotic plaque within the  visualized aortic arch and proximal major branch vessels of the neck. No hemodynamically significant innominate or proximal subclavian artery stenosis. Right carotid system: CCA and ICA patent within the neck without stenosis. Minimal calcified plaque within the carotid bifurcation. Tortuosity of the distal cervical ICA. Left carotid system: CCA and ICA patent within the neck without stenosis. Minimal soft and calcified plaque within the proximal ICA. Vertebral arteries: Patent within the neck without hemodynamically significant stenosis (50% or greater). Mild soft and calcified plaque at the origin of the dominant left vertebral artery.  Skeleton: No acute bony abnormality or aggressive osseous lesion. 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 Cervical spondylosis. Other neck: No neck mass or cervical lymphadenopathy. Upper chest: No consolidation within the imaged lung apices. Review of the MIP images confirms the above findings CTA HEAD FINDINGS Anterior circulation: The intracranial internal carotid arteries are patent. Atherosclerotic plaque within both vessels with no more than mild stenosis. The M1 middle cerebral arteries are patent. Moderate stenosis within the distal left M1 MCA segment (series 9, image 16). Atherosclerotic irregularity of the M2 and more distal middle cerebral artery branches bilaterally. However, no M2 proximal branch occlusion or high-grade proximal M2 stenosis is identified. The A1 right anterior cerebral artery is developmentally absent or markedly hypoplastic. The anterior cerebral arteries are otherwise patent. 1 mm laterally projecting vascular protrusion arising from the cavernous left ICA, which may reflect a tiny aneurysm (series 6, image 110). Posterior circulation: The intracranial vertebral arteries are patent. The basilar artery is patent. The posterior cerebral arteries are patent. Sites of moderate stenosis within the P1 and P2 right PCA segments. Moderate/severe stenosis  within the left PCA at the P1/P2 junction (series 11, image 21). Additional bilateral PCA distal branch atherosclerotic irregularity. Posterior communicating arteries are hypoplastic or absent bilaterally. Venous sinuses: Within the limitations of contrast timing, no convincing thrombus. Anatomic variants: As described Review of the MIP images confirms the above findings CT Brain Perfusion Findings: ASPECTS: 10 CBF (<30%) Volume: 0mL Perfusion (Tmax>6.0s) volume: 0mL Mismatch Volume: 0mL Infarction Location:None identified. No intracranial large vessel occlusion identified. These results were called by telephone at the time of interpretation on 03/05/2021 at 12:58 pm to provider Benjamin Rowland , who verbally acknowledged these results. IMPRESSION: CTA neck: The common carotid, internal carotid and vertebral arteries are patent within the neck without hemodynamically significant stenosis. Mild atherosclerotic disease, as described. CTA head: 1. No intracranial large vessel occlusion identified. 2. Intracranial atherosclerotic disease with multifocal stenoses, most notably as follows. 3. Moderate stenosis within the distal M1 left MCA. 4. Moderate/severe stenosis within the P2 left PCA. 5. Sites of moderate stenosis within the P1 and P2 segments of the right posterior cerebral artery. 6. 1 mm laterally projecting vascular protrusion arising from the cavernous left ICA, which may reflect a tiny aneurysm. CT perfusion head: The perfusion software identifies no core infarct. The perfusion software identifies no region of critically hypoperfused parenchyma utilizing the Tmax>6 seconds threshold. No mismatch reported. Electronically Signed   By: Jackey LogeKyle  Golden DO   On: 03/05/2021 13:01      HISTORY OF PRESENT ILLNESS Benjamin Rowland is a 80 y.o. 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 He was brought in to the ED and a stroke code was activated in the ED. CT Head w/o contrast with L frontal encephalomalacia with some calcification but no ICH.   Baseline mRS: 0 tPA: yes, offered. Decision to offer tPA at 1205. SBP was elevated and brought down with hydralazine 20mg  IV once before tPA was started. Thrombectomy: No LVO.   Rowland COURSE Uneventful. 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 At this time, stroke work up is complete. His CTA Head and Neck was pertinent for moderate stenosis within the distal left M1 MCA. MRI Brain showed an acute left frontal parietal stroke. Stroke labs w/LDL 151, Hemoglobin A1C 5.7. Echocardiogram was pertinent for EF 50 to 55 %, LA normal in size. Stroke etiology is cryptogenic; atherosclerosis versus cardioembolic.  - Continue Aspirin 81 mg and Atorvastatin 80 mg for secondary stroke prevention - Loop will be placed prior to dc   CARDS #Hypertension  No documented history of hypertension, diagnosed this admission given SBP trends. Current SBP goal is < 180 and will slowly normalize pressure at this time given he is out of the permissive HTN window. - Lisinopril 5 mg initiated 6/22, up titrate as needed   #Bradycardia Noted to be bradycardic w/HR in the mid to upper 50s, asymptomatic - If symptomatic will treat and consult cardiology   #Hyperlipidemia From a stroke prevention stand point, the LDL goal is < 70. His LDL is 151, thus he was started on Atorvastatin 80 mg HS this admission - Continue Atorvastatin 80 mg HS    RESPIRATORY Oxygenating well on RA, no acute issues at this time    ENDO  #Prediabetes A1C this admission noted to be 5.7, prediabetic range he is to follow up with PCP outpatient for prediabetes management    GI No dysphagia due to stroke, passed for a regular diet. Bowel regimen in place with Senokot QD   RENAL #AKI vs CKD  Cr noted to be 1.3 on CMP, unsure what baseline is. Are actively trending.  - Trend Cr and lytes, especially given Lisinopril was started on 6/22 - Gentle hydration as necessary, currently on NaCI 75 ml/hr    HEME/ID  #Leukocytosis  Hemoglobin, hematocrit and platelet count stable. He has a mild leukocytosis with WBC at 12.2 are trending at this time - FFWU if he fevers   DISPO PT and OT are recommending SNF at this time. Waiting for facility to be selected by family.    DISCHARGE EXAM Blood pressure (!) 157/98, pulse 89, temperature 98 F (36.7 C), resp. rate 16, height 5\' 11"  (1.803 m), weight 88.2 kg, SpO2 96 %. Constitutional: Pleasant elderly Caucasian male not in distress.  Calm, appropriate for condition Cardiovascular: Normal RR Respiratory: No increased WOB   Mental status: AAOX4, following commands  Speech: Naming intact, able to repeat with some hesitancy, impaired fluency. + Dysarthria  Cranial nerves: EOMI, VFF, R FD, Tongue midline  Motor: Normal bulk and tone. No drift. Diminished fine finger movements on the right.  Sensory: Intact to light touch throughout  Coordination: Intact FNF LUE Gait: Deferred  Discharge Diet       Diet   Diet regular Room service appropriate? Yes; Fluid consistency: Thin   liquids  DISCHARGE PLAN Disposition:  SNF aspirin 81 mg daily for secondary stroke prevention unless Afib is seen, OAC will be indicated Ongoing stroke risk factor control by Primary Care Physician at time of discharge Follow-up PCP , MD in 2 weeks. Follow-up in [facility] in 4 weeks, office to schedule an appointment.   35 minutes were spent  preparing discharge.  Desiree Metzger-Cihelka, ARNP-C, ANVP-BC Pager: [phone]   I have personally obtained history,examined this patient, reviewed notes, independently viewed imaging studies, participated in medical decision making and plan of care.ROS completed by me personally and pertinent positives fully documented  I have made any additions or  clarifications directly to the above note. Agree with note above.    Delia Heady, MD Medical Director [facility] Pager: [phone] 03/08/2021 4:10 PM

## 2021-03-08 NOTE — Progress Notes (Signed)
ST elevation noted on cardiac monitoring, called CCMD who reported elevation measuring at 2.3 and states the patient has been having ST elevation prior. Pt denies chest pain, vitals are stable. MD paged, new orders to place STAT EKG and STAT Troponin lab draw.   Orders placed.

## 2021-03-08 NOTE — Plan of Care (Signed)

## 2021-03-08 NOTE — Discharge Instructions (Signed)
Wound care instructions (heart monitor) Keep incision clean and dry for 3 days. You can remove outer plastic dressing tomorrow. Leave steri-strips (little pieces of tape) on until seen in the office for wound check appointment. Call the office ([phone]) for redness, drainage, swelling, or fever.  

## 2021-03-22 ENCOUNTER — Ambulatory Visit (INDEPENDENT_AMBULATORY_CARE_PROVIDER_SITE_OTHER): Payer: Medicare Other

## 2021-03-22 ENCOUNTER — Other Ambulatory Visit: Payer: Self-pay

## 2021-03-22 DIAGNOSIS — I63512 Cerebral infarction due to unspecified occlusion or stenosis of left middle cerebral artery: Secondary | ICD-10-CM

## 2021-03-22 LAB — CUP PACEART INCLINIC DEVICE CHECK
Date Time Interrogation Session: 20220707142700
Implantable Pulse Generator Implant Date: 20220623

## 2021-03-22 NOTE — Patient Instructions (Addendum)
Loop recorder insertion site healed. Mild bruising should resolve over time. Patient may shower. No restrictions today related to loop. Please continue to monitor with provided Iphone. Call device clinic at [phone] with any questions or concerns.

## 2021-03-22 NOTE — Progress Notes (Addendum)
Wound check appointment for ILR II implanted 03/08/21 for cryptogenic stroke. Dressing and steri strips removed. Wound healed. See scanned document in epic reflecting 03/22/21 transmission (per [facility] device does not need to be interrogated). Nightly transmissions confirmed. Battery status: Good. 0 symptom episodes, 0 tachy episodes, 0 pause episodes, 0 brady episodes. 0 AF episodes (0% burden). Monthly summary reports with next transmission scheduled 04/17/21.

## 2021-04-17 ENCOUNTER — Ambulatory Visit (INDEPENDENT_AMBULATORY_CARE_PROVIDER_SITE_OTHER): Payer: Medicare Other

## 2021-04-17 DIAGNOSIS — I63512 Cerebral infarction due to unspecified occlusion or stenosis of left middle cerebral artery: Secondary | ICD-10-CM

## 2021-04-17 LAB — CUP PACEART REMOTE DEVICE CHECK
Date Time Interrogation Session: 20220802113359
Implantable Pulse Generator Implant Date: 20220623

## 2021-04-19 ENCOUNTER — Encounter: Payer: Self-pay | Admitting: Adult Health

## 2021-04-19 ENCOUNTER — Other Ambulatory Visit: Payer: Self-pay

## 2021-04-19 ENCOUNTER — Ambulatory Visit (INDEPENDENT_AMBULATORY_CARE_PROVIDER_SITE_OTHER): Payer: Medicare Other | Admitting: Adult Health

## 2021-04-19 VITALS — BP 128/65 | HR 66 | Ht 71.0 in

## 2021-04-19 DIAGNOSIS — R351 Nocturia: Secondary | ICD-10-CM

## 2021-04-19 DIAGNOSIS — M21372 Foot drop, left foot: Secondary | ICD-10-CM

## 2021-04-19 DIAGNOSIS — I10 Essential (primary) hypertension: Secondary | ICD-10-CM

## 2021-04-19 DIAGNOSIS — R339 Retention of urine, unspecified: Secondary | ICD-10-CM

## 2021-04-19 DIAGNOSIS — I63412 Cerebral infarction due to embolism of left middle cerebral artery: Secondary | ICD-10-CM

## 2021-04-19 DIAGNOSIS — E785 Hyperlipidemia, unspecified: Secondary | ICD-10-CM | POA: Diagnosis not present

## 2021-04-19 NOTE — Progress Notes (Signed)
[facility] [address] [place]. [place] [address] [phone]       HOSPITAL FOLLOW UP NOTE  [name] Date of Birth:  November 22, 1940 Medical Record Number:  [id number]   Reason for Referral:  hospital stroke follow up    SUBJECTIVE:   CHIEF COMPLAINT:  Chief Complaint  Patient presents with   Stroke    Rm 2, hospital FU, cousin- Benjamin Rowland, residing at [facility]    HPI:   Benjamin Rowland is a 80 y.o. 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  Personally reviewed hospitalization pertinent progress notes, lab work and imaging with summary provided.  Stroke work-up acute infarcts left frontoparietal, L parietal lobe and left occipital s/p tPA secondary to left MCA stenosis vs embolization with partial recanalization, cryptogenic etiology -arthrosclerosis vs embolic.  Loop recorder placed.  EF 50 to 55%.  LDL 151.  A1c 5.7.  Initiated aspirin and atorvastatin 80 mg daily for secondary stroke prevention.  No prior history of HTN and initiated lisinopril 5 mg daily for elevated BPs.  No prior stroke history.  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 D/c'd to [facility] 6/23.   Today, 04/19/2021, [name] is being seen for hospital follow-up accompanied by his cousin, Benjamin Rowland.  Remains at [facility] -receiving therapies for residual speech difficulty (slurring and occasional word finding difficulties worse when fatigued) and right sided weakness.  Does report improvement since discharge but is frustrated due to his continued deficits.  Ambulates with cane short distance - w/c long distance. No AD prior to stroke. Living alone prior maintaining ADLs and IADLs independently. His goal is to return back home.  Denies new stroke/TIA symptoms.  Compliant on aspirin and  atorvastatin -denies side effects.  Blood pressure today 128/65.  Loop recorder has not shown atrial fibrillation thus far.  He does question need of all current prescribed medications -recently started on finasteride and tamsulosin by SNF for urinary retention - per pt, was found to have UTI - since completed antibiotics. Feels like he is voiding too much at night - approx every 1.5 hours will wake up to use urinal. Voids "very seldomly" due the day. Does have difficulty initiating stream.  Endorses some issues with this prior to his stroke but worsened post stroke.  He denies previously speaking of this with his PCP or prostate evaluation before (unable to verify via epic as unable to view PCP notes or labs).  No further concerns at this time.      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HCC)    Hyperlipidemia    Seizures (HCC)    none since brain surgery   Stroke [facility])     PSH:  Past Surgical History:  Procedure Laterality Date   BRAIN SURGERY  1985   R frontal lobe   LOOP RECORDER INSERTION N/A 03/08/2021   Procedure: LOOP RECORDER INSERTION;  Surgeon: Lanier Prude, MD;  Location: [facility] INVASIVE CV LAB;  Service: Cardiovascular;  Laterality: N/A;    Social History:  Social History   Socioeconomic History   Marital status: Single  Spouse name: Not on file   Number of children: 0   Years of education: Not on file   Highest education level: Not on file  Occupational History   Not on file  Tobacco Use   Smoking status: Never   Smokeless tobacco: Never  Substance and Sexual Activity   Alcohol use: Not Currently   Drug use: Never   Sexual activity: Not on file  Other Topics Concern   Not on file  Social History Narrative   8/4/222 living at [facility]   Social  Determinants of Health   Financial Resource Strain: Not on file  Food Insecurity: Not on file  Transportation Needs: Not on file  Physical Activity: Not on file  Stress: Not on file  Social Connections: Not on file  Intimate Partner Violence: Not on file    Family History: No family history on file.  Medications:   Current Outpatient Medications on File Prior to Visit  Medication Sig Dispense Refill   acetaminophen (TYLENOL) 325 MG tablet Take 2 tablets (650 mg total) by mouth every 4 (four) hours as needed for mild pain (or temp > 37.5 C (99.5 F)).     acetaminophen (TYLENOL) 500 MG tablet Take 500 mg by mouth every 4 (four) hours as needed.     aspirin 81 MG chewable tablet Chew 1 tablet (81 mg total) by mouth daily.     atorvastatin (LIPITOR) 80 MG tablet Take 1 tablet (80 mg total) by mouth at bedtime. 30 tablet 0   Cholecalciferol (VITAMIN D-3 PO) Take 1 tablet by mouth daily.     finasteride (PROSCAR) 5 MG tablet Take 5 mg by mouth daily.     lisinopril (ZESTRIL) 5 MG tablet Take 1 tablet (5 mg total) by mouth daily. 30 tablet 0   pantoprazole (PROTONIX) 40 MG tablet Take 1 tablet (40 mg total) by mouth at bedtime. 30 tablet 0   tamsulosin (FLOMAX) 0.4 MG CAPS capsule Take 0.4 mg by mouth in the morning and at bedtime.     No current facility-administered medications on file prior to visit.    Allergies:  No Known Allergies    OBJECTIVE:  Physical Exam  Vitals:   04/19/21 1013  BP: 128/65  Pulse: 66  Height: 5\' 11"  (1.803 m)   Body mass index is 27.12 kg/m. No results found.  Post stroke PHQ 2/9 Depression screen PHQ 2/9 04/19/2021  Decreased Interest 0  Down, Depressed, Hopeless 0  PHQ - 2 Score 0     General: well developed, well nourished, very pleasant elderly Caucasian male, seated, in no evident distress Head: head normocephalic and atraumatic.   Neck: supple with no carotid or supraclavicular bruits Cardiovascular: regular rate and rhythm, no  murmurs Musculoskeletal: left knee brace 2/2 osteoarthritis Skin:  no rash/petichiae Vascular:  Normal pulses all extremities   Neurologic Exam Mental Status: Awake and fully alert.  Mild to moderate dysarthria and occasional word finding difficulty.  Unable to appreciate receptive aphasia.  Oriented to place and time. Recent and remote memory intact. Attention span, concentration and fund of knowledge appropriate. Mood and affect appropriate.  Cranial Nerves: Fundoscopic exam reveals sharp disc margins. Pupils equal, briskly reactive to light. Extraocular movements full without nystagmus. Visual fields full to confrontation. Hearing intact. Facial sensation intact. Face, tongue, palate moves normally and symmetrically.  Motor:  RUE: 5/5 deltoid and elbow extension; 4/5 elbow flexion; 3/5 grip strength with increased tone  RLE: 5/5 proximal and 4/5 ankle dorsiflexion  LUE: 5/5 LLE: 5/5 proximal with left foot drop (?chronic per patient) Sensory.: intact to touch , pinprick , position and vibratory sensation.  Coordination: Rapid alternating movements normal in all extremities except right hand. Finger-to-nose and heel-to-shin performed accurately bilaterally. Gait and Station: Deferred Reflexes: 1+ and symmetric. Toes downgoing.     NIHSS  2 Modified Rankin  4      ASSESSMENT: Benjamin Rowland is a 80 y.o. 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  Vascular risk factors include new dx of HTN, HLD, pre-DM, hx of seizures s/p crani and L frontal lobe resection 1980s.      PLAN:  Cryptogenic stroke:  Residual deficit: Right hemiparesis, dysarthria and expressive aphasia. Overall making good progress. Continue working with SNF PT/OT/SLP for likely ongoing recovery Loop recorder has not shown atrial fibrillation thus far  -monitored monthly per cardiology Continue aspirin 81 mg daily  and atorvastatin 80 mg daily for secondary stroke prevention.   Discussed secondary stroke prevention measures and importance of close PCP follow up for aggressive stroke risk factor management. I have gone over the pathophysiology of stroke, warning signs and symptoms, risk factors and their management in some detail with instructions to go to the closest emergency room for symptoms of concern. HTN: BP goal <130/90.  Stable on lisinopril 5 mg daily per PCP/facility HLD: LDL goal <70. Recent LDL 151 -continue atorvastatin 80 mg daily.  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 Recent A1c 5.7.  Urinary retention/excessive nocturia: Chronic issue worsened post stroke. Per pt, tx'd for UTI approx 3 weeks ago - remains on finasteride and tamsulosin. Check PSA today - possible BPH? 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  Previously seen by orthopedics Dr. Roda Shutters recommended knee replacement in 10/2019 but patient declined intervention at that time. Would recommend follow-up with orthopedics for reevaluation after further stroke recovery as he would not be a candidate at this time for any surgical intervention in setting of recent stroke    Follow up in 3 months or call earlier if needed   CC:  [facility] provider: Dr. Pearlean Brownie PCP: Kaleen Mask, MD    I spent 58 minutes of face-to-face and non-face-to-face time with patient and cousin.  This included previsit chart review including review of recent hospitalization, lab review, study review, order entry, electronic  health record documentation, patient and cousin education regarding recent stroke, potential etiologies, secondary stroke prevention measures and importance of managing stroke risk factors, residual stroke deficits and typical recovery time, left foot drop and likely etiologies, urinary retention with excessive nocturia and further discussion and answered all other questions to patient and cousins satisfaction   Ihor Austin, AGNP-BC  [facility] [address] [place], [place] [address]  Phone [phone] Fax [phone] Note: This document was prepared with digital dictation and possible smart phrase technology. Any transcriptional errors that result from this process are unintentional.

## 2021-04-19 NOTE — Patient Instructions (Addendum)
You were seen by Dr. Pearlean Brownie while you were in the hospital.  He has been making great progress and will likely continue to see progress as you continue to work with therapies  Your loop recorder has not shown atrial fibrillation thus far - if atrial fibrillation is found, we would have to put you a stronger blood thinner such as Eliquis   Continue aspirin 81 mg daily  and atorvastatin 80 mg daily for secondary stroke prevention  Continue to follow up with PCP regarding cholesterol, blood pressure and prediabetes management  Maintain strict control of hypertension with blood pressure goal below 130/90, prediabetes with hemoglobin A1c goal below 7% and cholesterol with LDL cholesterol (bad cholesterol) goal below 70 mg/dL.  We will recheck your cholesterol levels today  We will check your prostate levels today due to reported retention/limited urination during the day and excessive urination at night.  Please ensure you speak further with a provider at [facility] regarding ongoing need finasteride and tamsulosin     Followup in the future with me in 3 months or call earlier if needed       Thank you for coming to see [place] at [facility]. I hope we have been able to provide you high quality care today.  You may receive a patient satisfaction survey over the next few weeks. We would appreciate your feedback and comments so that we may continue to improve ourselves and the health of our patients.

## 2021-04-19 NOTE — Progress Notes (Signed)
I agree with the above plan 

## 2021-04-20 LAB — LIPID PANEL
Chol/HDL Ratio: 2.1 ratio (ref 0.0–5.0)
Cholesterol, Total: 98 mg/dL — ABNORMAL LOW (ref 100–199)
HDL: 46 mg/dL
LDL Chol Calc (NIH): 39 mg/dL (ref 0–99)
Triglycerides: 56 mg/dL (ref 0–149)
VLDL Cholesterol Cal: 13 mg/dL (ref 5–40)

## 2021-04-20 LAB — PSA: Prostate Specific Ag, Serum: 0.2 ng/mL (ref 0.0–4.0)

## 2021-04-23 ENCOUNTER — Telehealth: Payer: Self-pay

## 2021-04-23 NOTE — Telephone Encounter (Signed)
PT niece returned call, informed her, per DPR, that recent lab work was satisfactory.  Continue atorvastatin 80 mg daily. Per Shanda Bumps, He may still have some issue with prostate even with normal PSA level - if symptoms persist, she would recommend further discussion with his PCP and possiblereferral to urology if needed. Advised to call the office with questions as she had none at the time and was appreciative.

## 2021-04-23 NOTE — Telephone Encounter (Signed)
-----   Message from Ihor Austin, NP sent at 04/23/2021 11:23 AM EDT ----- Please advise patient/cousin that recent lab work was satisfactory.  Continue atorvastatin 80 mg daily.  He may still have some issue with prostate even with normal PSA level - if symptoms persist, would recommend further discussion with his PCP and possible referral to urology if needed.

## 2021-04-23 NOTE — Telephone Encounter (Signed)
Pts niece

## 2021-04-23 NOTE — Telephone Encounter (Signed)
-----   Message from Jessica McCue, NP sent at 04/23/2021 11:23 AM EDT ----- Please advise patient/cousin that recent lab work was satisfactory.  Continue atorvastatin 80 mg daily.  He may still have some issue with prostate even with normal PSA level - if symptoms persist, would recommend further discussion with his PCP and possible referral to urology if needed. 

## 2021-04-23 NOTE — Telephone Encounter (Signed)
I called pt. No answer, left a message asking pt to call me back.   

## 2021-05-12 NOTE — Progress Notes (Signed)
Carelink Summary Report / Loop Recorder 

## 2021-05-22 ENCOUNTER — Ambulatory Visit (INDEPENDENT_AMBULATORY_CARE_PROVIDER_SITE_OTHER): Payer: Medicare Other

## 2021-05-22 DIAGNOSIS — I63512 Cerebral infarction due to unspecified occlusion or stenosis of left middle cerebral artery: Secondary | ICD-10-CM | POA: Diagnosis not present

## 2021-05-22 LAB — CUP PACEART REMOTE DEVICE CHECK
Date Time Interrogation Session: 20220904113327
Implantable Pulse Generator Implant Date: 20220623

## 2021-05-31 NOTE — Progress Notes (Signed)
Carelink Summary Report / Loop Recorder 

## 2021-06-01 ENCOUNTER — Other Ambulatory Visit: Payer: Self-pay

## 2021-06-01 NOTE — Patient Outreach (Signed)
[facility]) Care Management  06/01/2021  Benjamin Rowland 1941-02-03 [id number]   Telephone outreach to patient to obtain mRS was successfully completed. MRS= 3   [name] [facility] Care Management Assistant

## 2021-06-25 ENCOUNTER — Ambulatory Visit (INDEPENDENT_AMBULATORY_CARE_PROVIDER_SITE_OTHER): Payer: Medicare Other

## 2021-06-25 DIAGNOSIS — I63512 Cerebral infarction due to unspecified occlusion or stenosis of left middle cerebral artery: Secondary | ICD-10-CM | POA: Diagnosis not present

## 2021-06-27 LAB — CUP PACEART REMOTE DEVICE CHECK
Date Time Interrogation Session: 20221007113335
Implantable Pulse Generator Implant Date: 20220623

## 2021-07-04 NOTE — Progress Notes (Signed)
Carelink Summary Report / Loop Recorder 

## 2021-07-26 ENCOUNTER — Ambulatory Visit (INDEPENDENT_AMBULATORY_CARE_PROVIDER_SITE_OTHER): Payer: Medicare Other | Admitting: Adult Health

## 2021-07-26 ENCOUNTER — Encounter: Payer: Self-pay | Admitting: Adult Health

## 2021-07-26 VITALS — BP 149/82 | HR 81 | Ht 70.0 in | Wt 164.0 lb

## 2021-07-26 DIAGNOSIS — I10 Essential (primary) hypertension: Secondary | ICD-10-CM

## 2021-07-26 DIAGNOSIS — I69398 Other sequelae of cerebral infarction: Secondary | ICD-10-CM

## 2021-07-26 DIAGNOSIS — I69328 Other speech and language deficits following cerebral infarction: Secondary | ICD-10-CM

## 2021-07-26 DIAGNOSIS — E785 Hyperlipidemia, unspecified: Secondary | ICD-10-CM

## 2021-07-26 DIAGNOSIS — R269 Unspecified abnormalities of gait and mobility: Secondary | ICD-10-CM

## 2021-07-26 DIAGNOSIS — I63412 Cerebral infarction due to embolism of left middle cerebral artery: Secondary | ICD-10-CM

## 2021-07-26 NOTE — Progress Notes (Signed)
[facility] [address] [place]. [place] [address] [phone]       STROKE FOLLOW UP NOTE  Benjamin Rowland Date of Birth:  1941-05-31 Medical Record Number:  [id number]   Reason for Referral: stroke follow up    SUBJECTIVE:   CHIEF COMPLAINT:  Chief Complaint  Patient presents with   Follow-up    Rm 3 alone  Pt is well and stable, no new complications. Is out of SNF, home with family  Wants to get off some medications, too expensive.      HPI:   Update 07/26/2021 JM: Returns for 71-month stroke follow-up unaccompanied (neighbor brought him who waited in the lobby)   Doing well since prior visit.  Denies new stroke/TIA symptoms He has since been released from SNF currently living with family - he is eager to return back to his own home Reports residual right side weakness, imbalance and mild speech difficulty with fatigue  Working with [facility] 1x/week - PT/OT - does report ongoing improvement but not recovering "fast enough" Continues to ambulate with a cane -denies any recent falls  Compliant on aspirin 81 mg daily and atorvastatin 80 mg daily -denies side effects Blood pressure today 149/82.  Routinely monitored at home and typically stable on lisinopril 10 mg daily Loop recorder has not shown atrial fibrillation thus far  He was seen by PCP Dr. Jeannetta Nap since d/c - he has concerns of weight loss and stopping some of his medications due to cost (pantoprazole, tamsulosin, finasteride) - he does not drug coverage. He plans to schedule f/u visit with PCP to further discuss  No further concerns at this time     History provided for reference purposes only Initial visit 04/19/2021 JM: Benjamin Rowland is being seen for hospital follow-up accompanied by his cousin, Benjamin Rowland.  Remains at [facility] -receiving therapies for residual speech difficulty (slurring and occasional word finding difficulties worse when fatigued) and right sided weakness.  Does report improvement  since discharge but is frustrated due to his continued deficits.  Ambulates with cane short distance - w/c long distance. No AD prior to stroke. Living alone prior maintaining ADLs and IADLs independently. His goal is to return back home.  Denies new stroke/TIA symptoms.  Compliant on aspirin and atorvastatin -denies side effects.  Blood pressure today 128/65.  Loop recorder has not shown atrial fibrillation thus far.  He does question need of all current prescribed medications -recently started on finasteride and tamsulosin by SNF for urinary retention - per pt, was found to have UTI - since completed antibiotics. Feels like he is voiding too much at night - approx every 1.5 hours will wake up to use urinal. Voids "very seldomly" due the day. Does have difficulty initiating stream.  Endorses some issues with this prior to his stroke but worsened post stroke.  He denies previously speaking of this with his PCP or prostate evaluation before (unable to verify via epic as unable to view PCP notes or labs).  No further concerns at this time.   Stroke admission on 03/05/2021 Benjamin Rowland is a 80 y.o. 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  Personally reviewed hospitalization pertinent progress notes, lab work and imaging with summary provided.  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  Loop recorder placed.  EF 50 to 55%.  LDL 151.  A1c 5.7.  Initiated aspirin and atorvastatin 80 mg daily for secondary stroke prevention.  No prior history of HTN and initiated lisinopril 5 mg daily for elevated BPs.  No prior stroke history.  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 D/c'd to [facility] 6/23.       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[place])    Hyperlipidemia    Seizures ([place])    none since brain surgery   Stroke [facility])     PSH:  Past Surgical History:  Procedure Laterality Date   [place]   R frontal lobe   LOOP RECORDER INSERTION N/A 03/08/2021   Procedure: LOOP RECORDER INSERTION;  Surgeon: Vickie Epley, MD;  Location: [place] [facility];  Service: Cardiovascular;  Laterality: N/A;    Social History:  Social History   Socioeconomic History   Marital status: Single    Spouse name: Not on file   Number of children: 0   Years of education: Not on file   Highest education level: Not on file  Occupational History   Not on file  Tobacco Use   Smoking status: Never   Smokeless tobacco: Never  Substance and Sexual Activity   Alcohol use: Not Currently   Drug use: Never   Sexual activity: Not on file  Other Topics Concern   Not on file  Social History Narrative   8/4/222 living at [facility]   Social Determinants of Health   Financial Resource Strain: Not on file  Food Insecurity: Not on file  Transportation Needs: Not on file  Physical Activity: Not on file  Stress: Not on file  Social Connections: Not on file  Intimate Partner Violence: Not on file    Family History: History reviewed. No pertinent family history.  Medications:   Current Outpatient Medications on File Prior to Visit  Medication Sig Dispense Refill   acetaminophen (TYLENOL) 325 MG tablet Take 2 tablets (650 mg total) by mouth every 4 (four) hours as needed for mild pain (or temp > 37.5 C (99.5 F)).     acetaminophen (TYLENOL) 500 MG tablet Take 500 mg by mouth every 4 (four) hours as needed.      aspirin 81 MG chewable tablet Chew 1 tablet (81 mg total) by mouth daily.     atorvastatin (LIPITOR) 80 MG tablet Take 1 tablet (80 mg total) by mouth at bedtime. 30 tablet 0   Cholecalciferol (VITAMIN D-3 PO) Take 1 tablet by mouth daily.     finasteride (PROSCAR) 5 MG tablet Take 5 mg by mouth daily.     lisinopril (ZESTRIL) 10 MG tablet Take 10 mg by mouth daily.     pantoprazole (PROTONIX) 40 MG tablet Take 1 tablet (40 mg total) by mouth at bedtime. 30 tablet 0   tamsulosin (FLOMAX) 0.4 MG CAPS capsule Take 0.4 mg by mouth in the morning and at bedtime.     No current facility-administered medications on file prior to visit.    Allergies:  No Known Allergies    OBJECTIVE:  Physical Exam  Vitals:   07/26/21 0948  BP: (!) 149/82  Pulse: 81  Weight: 164 lb (74.4 kg)  Height: 5\' 10"  (1.778 m)    Body mass index is 23.53 kg/m. No results found.  General: well developed, well nourished, very pleasant elderly Caucasian male, seated, in no evident distress Head: head normocephalic and atraumatic.   Neck: supple with no carotid or supraclavicular bruits Cardiovascular: regular rate and rhythm, no murmurs Musculoskeletal: left knee brace 2/2 osteoarthritis Skin:  no rash/petichiae Vascular:  Normal pulses all extremities   Neurologic Exam Mental Status: Awake and fully alert. Mild dysarthria and speech hesitancy.  Able to follow commands without difficulty and name objects without difficulty.  Oriented to place and time. Recent and remote memory intact. Attention span, concentration and fund of knowledge appropriate. Mood and affect appropriate.  Cranial Nerves: Pupils equal, briskly reactive to light. Extraocular movements full without nystagmus. Visual fields full to confrontation. Hearing intact. Facial sensation intact. Slight R nasolabial fold flattening.  Tongue, palate moves normally and symmetrically.  Motor:  RUE: 5/5 proximal and decreased grip strengh (although  improved since prior visit) RLE: 5/5 proximal and 4+/5 ankle dorsiflexion  LUE: 5/5 LLE: 5/5 proximal and 4+/5 ankle dorsiflexion (chronic per patient) Sensory.: intact to touch , pinprick , position and vibratory sensation.  Coordination: Rapid alternating movements normal in all extremities except right hand. Finger-to-nose and heel-to-shin performed accurately bilaterally. Gait and Station: Stands from seated position with mild difficulty.  Stance is slightly hunched.  Gait demonstrates short shuffled steps with mild unsteadiness and use of cane.  Tandem walk and heel toe not attempted Reflexes: 1+ and symmetric. Toes downgoing.         ASSESSMENT: Benjamin Rowland is a 80 y.o. 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  Vascular risk factors include new dx of HTN, HLD, pre-DM, hx of seizures s/p crani and L frontal lobe resection 1980s.      PLAN:  Cryptogenic stroke:  Residual deficit: Mild right hemiparesis, gait impairment, mild dysarthria and occasional expressive aphasia when fatigued.  Continue working with [facility] PT/OT - once completed, if additional therapy is needed, can consider OP therapies for hopeful further recovery Loop recorder has not shown atrial fibrillation thus far -monitored by cardiology Continue aspirin 81 mg daily  and atorvastatin 80 mg daily for secondary stroke prevention.  Pt was provided GoodRx card to further assist with medication cost as he currently has no drug coverage  Discussed secondary stroke prevention measures and importance of close PCP follow up for aggressive stroke risk factor management. I have gone over the pathophysiology of stroke, warning signs and symptoms, risk factors and their management in some detail with instructions to go to the closest emergency room for symptoms of  concern. HTN: BP goal <130/90.  Stable on lisinopril 10 mg daily per PCP HLD: LDL goal <70. Recent LDL 39 down from 151 on atorvastatin 80 mg daily managed by PCP    Follow up in 6 months or call earlier if needed   CC:  PCP: Leonard Downing, MD    I spent 33 minutes of face-to-face and non-face-to-face time with patient.  This included previsit chart review, lab review, study review, electronic health record documentation, patient education regarding prior stroke, potential etiologies and monitoring of loop recorder, secondary stroke prevention measures and importance of managing stroke risk factors, residual stroke deficits and hopeful further recovery  and answered all other questions to patient and cousins satisfaction   Frann Rider, [facility]  [facility] [address] [place], [place] [address]  Phone [phone] Fax [phone] Note: This document was prepared with digital dictation and possible smart phrase technology. Any transcriptional errors that result from this process are unintentional.

## 2021-07-26 NOTE — Patient Instructions (Signed)
Continue aspirin 81 mg daily  and atorvastatin 80mg  daily  for secondary stroke prevention life long unless contraindicated in the future  Continue to follow up with PCP regarding cholesterol and blood pressure management  Maintain strict control of hypertension with blood pressure goal below 130/90 and cholesterol with LDL cholesterol (bad cholesterol) goal below 70 mg/dL.   Continue working with home health therapies - once completed, if you would like to participate in additional outpatient therapies, please let me know  Please look at GoodRx savings program for medications - this can be accessed online or can download an app - some of your current medications can be cheaper at different pharmacies (such as atorvastatin - you can get a 3 mo supply for $30 dollars at [facility])  Please ensure you schedule a follow-up visit with your PCP to discuss your current medications and possible stopping some of them such as your Proscar and Flomax     Followup in the future with me in 6 months or call earlier if needed       Thank you for coming to see [facility] at [facility]. I hope we have been able to provide you high quality care today.  You may receive a patient satisfaction survey over the next few weeks. We would appreciate your feedback and comments so that we may continue to improve ourselves and the health of our patients.

## 2021-07-27 LAB — CUP PACEART REMOTE DEVICE CHECK
Date Time Interrogation Session: 20221109113507
Implantable Pulse Generator Implant Date: 20220623

## 2021-07-30 ENCOUNTER — Ambulatory Visit (INDEPENDENT_AMBULATORY_CARE_PROVIDER_SITE_OTHER): Payer: Medicare Other

## 2021-07-30 DIAGNOSIS — I63512 Cerebral infarction due to unspecified occlusion or stenosis of left middle cerebral artery: Secondary | ICD-10-CM

## 2021-08-07 NOTE — Progress Notes (Signed)
Carelink Summary Report / Loop Recorder 

## 2021-08-29 LAB — CUP PACEART REMOTE DEVICE CHECK
Date Time Interrogation Session: 20221212113337
Implantable Pulse Generator Implant Date: 20220623

## 2021-09-03 ENCOUNTER — Ambulatory Visit (INDEPENDENT_AMBULATORY_CARE_PROVIDER_SITE_OTHER): Payer: Medicare Other

## 2021-09-03 DIAGNOSIS — I63512 Cerebral infarction due to unspecified occlusion or stenosis of left middle cerebral artery: Secondary | ICD-10-CM | POA: Diagnosis not present

## 2021-09-13 NOTE — Progress Notes (Signed)
Carelink Summary Report / Loop Recorder 

## 2021-10-08 ENCOUNTER — Ambulatory Visit (INDEPENDENT_AMBULATORY_CARE_PROVIDER_SITE_OTHER): Payer: Medicare Other

## 2021-10-08 DIAGNOSIS — I63512 Cerebral infarction due to unspecified occlusion or stenosis of left middle cerebral artery: Secondary | ICD-10-CM

## 2021-10-08 LAB — CUP PACEART REMOTE DEVICE CHECK
Date Time Interrogation Session: 20230122230529
Implantable Pulse Generator Implant Date: 20220623

## 2021-10-19 NOTE — Progress Notes (Signed)
Carelink Summary Report / Loop Recorder 

## 2021-11-12 ENCOUNTER — Ambulatory Visit (INDEPENDENT_AMBULATORY_CARE_PROVIDER_SITE_OTHER): Payer: Medicare Other

## 2021-11-12 DIAGNOSIS — I63512 Cerebral infarction due to unspecified occlusion or stenosis of left middle cerebral artery: Secondary | ICD-10-CM

## 2021-11-12 LAB — CUP PACEART REMOTE DEVICE CHECK
Date Time Interrogation Session: 20230226230208
Implantable Pulse Generator Implant Date: 20220623

## 2021-11-19 NOTE — Progress Notes (Signed)
Carelink Summary Report / Loop Recorder 

## 2021-12-17 ENCOUNTER — Ambulatory Visit (INDEPENDENT_AMBULATORY_CARE_PROVIDER_SITE_OTHER): Payer: Medicare Other

## 2021-12-17 DIAGNOSIS — I63512 Cerebral infarction due to unspecified occlusion or stenosis of left middle cerebral artery: Secondary | ICD-10-CM

## 2021-12-18 LAB — CUP PACEART REMOTE DEVICE CHECK
Date Time Interrogation Session: 20230331230710
Implantable Pulse Generator Implant Date: 20220623

## 2021-12-31 NOTE — Progress Notes (Signed)
Carelink Summary Report / Loop Recorder 

## 2022-01-21 ENCOUNTER — Ambulatory Visit (INDEPENDENT_AMBULATORY_CARE_PROVIDER_SITE_OTHER): Payer: Medicare Other

## 2022-01-21 DIAGNOSIS — I63512 Cerebral infarction due to unspecified occlusion or stenosis of left middle cerebral artery: Secondary | ICD-10-CM | POA: Diagnosis not present

## 2022-01-21 LAB — CUP PACEART REMOTE DEVICE CHECK
Date Time Interrogation Session: 20230507230420
Implantable Pulse Generator Implant Date: 20220623

## 2022-01-24 ENCOUNTER — Encounter: Payer: Self-pay | Admitting: Adult Health

## 2022-01-24 ENCOUNTER — Ambulatory Visit (INDEPENDENT_AMBULATORY_CARE_PROVIDER_SITE_OTHER): Payer: Medicare Other | Admitting: Adult Health

## 2022-01-24 VITALS — BP 153/77 | HR 72 | Ht 70.0 in | Wt 164.0 lb

## 2022-01-24 DIAGNOSIS — I63412 Cerebral infarction due to embolism of left middle cerebral artery: Secondary | ICD-10-CM | POA: Diagnosis not present

## 2022-01-24 NOTE — Patient Instructions (Signed)
Continue aspirin 81 mg daily  and atorvastatin  for secondary stroke prevention ? ?Loop recorder has not shown atrial fibrillation thus far - their office number is [phone] if you would like to schedule a follow up visit to discuss removal of loop recorder ? ?Continue to follow up with PCP regarding cholesterol and blood pressure management  ?Maintain strict control of hypertension with blood pressure goal below 130/90 and cholesterol with LDL cholesterol (bad cholesterol) goal below 70 mg/dL.  ? ?Signs of a Stroke? Follow the BEFAST method:  ?Balance Watch for a sudden loss of balance, trouble with coordination or vertigo ?Eyes Is there a sudden loss of vision in one or both eyes? Or double vision?  ?Face: Ask the person to smile. Does one side of the face droop or is it numb?  ?Arms: Ask the person to raise both arms. Does one arm drift downward? Is there weakness or numbness of a leg? ?Speech: Ask the person to repeat a simple phrase. Does the speech sound slurred/strange? Is the person confused ? ?Time: If you observe any of these signs, call 911. ? ? ? ? ? ? ?Thank you for coming to see [place] at [facility]. I hope we have been able to provide you high quality care today. ? ?You may receive a patient satisfaction survey over the next few weeks. We would appreciate your feedback and comments so that we may continue to improve ourselves and the health of our patients. ? ?

## 2022-01-24 NOTE — Progress Notes (Signed)
?[facility] ?[address] ?[place]. [place] [address] ?[phone] ? ?     STROKE FOLLOW UP NOTE ? ?[name] ?Date of Birth:  September 21, 1940 ?Medical Record Number:  [id number]  ? ?Reason for Referral: stroke follow up ? ? ? ?SUBJECTIVE: ? ? ?CHIEF COMPLAINT:  ?Chief Complaint  ?Patient presents with  ? Follow-up  ?  RM 2 alone  ?Pt is well and stable, no new stroke concerns   ? ? ? ?HPI:  ? ?Update 01/24/2022 JM: Patient returns for 25-month stroke follow-up unaccompanied.  He has been stable without new stroke/TIA symptoms.  Currently reports right hand weakness and mild arm and leg weakness.  Continued mild dysarthria which can worsen with increased fatigue.  He has not yet moved back to his own home, currently living with relatives. Able to maintain ADLs independently but does need assistance for IADLs.  Ambulates with a cane, denies any recent falls.  Compliant on aspirin and atorvastatin, denies side effects.  Blood pressure today 153/77.  Routinely followed by PCP.  Loop recorder has not shown atrial fibrillation thus far. He wishes to have device removed due to cost and does not believe it is worth continuing.  No further concerns at this time. ? ? ?History provided for reference purposes only ?Update 07/26/2021 JM: Returns for 61-month stroke follow-up unaccompanied (neighbor brought him who waited in the lobby)  ? ?Doing well since prior visit.  Denies new stroke/TIA symptoms ?He has since been released from SNF currently living with family - he is eager to return back to his own home ?Reports residual right side weakness, imbalance and mild speech difficulty with fatigue  ?Working with [facility] - PT/OT - does report ongoing improvement but not recovering "fast enough" ?Continues to ambulate with a cane -denies any recent falls ? ?Compliant on aspirin 81 mg daily and atorvastatin 80 mg daily -denies side effects ?Blood pressure today 149/82.  Routinely monitored at home and typically  stable on lisinopril 10 mg daily ?Loop recorder has not shown atrial fibrillation thus far ? ?He was seen by PCP Dr. Arelia Sneddon since d/c - he has concerns of weight loss and stopping some of his medications due to cost (pantoprazole, tamsulosin, finasteride) - he does not drug coverage. He plans to schedule f/u visit with PCP to further discuss ? ?No further concerns at this time ? ?Initial visit 04/19/2021 JM: [name] is being seen for hospital follow-up accompanied by his cousin, Benjamin Rowland.  Remains at [facility] -receiving therapies for residual speech difficulty (slurring and occasional word finding difficulties worse when fatigued) and right sided weakness.  Does report improvement since discharge but is frustrated due to his continued deficits.  Ambulates with cane short distance - w/c long distance. No AD prior to stroke. Living alone prior maintaining ADLs and IADLs independently. His goal is to return back home.  Denies new stroke/TIA symptoms.  Compliant on aspirin and atorvastatin -denies side effects.  Blood pressure today 128/65.  Loop recorder has not shown atrial fibrillation thus far.  He does question need of all current prescribed medications -recently started on finasteride and tamsulosin by SNF for urinary retention - per pt, was found to have UTI - since completed antibiotics. Feels like he is voiding too much at night - approx every 1.5 hours will wake up to use urinal. Voids "very seldomly" due the day. Does have difficulty initiating stream.  Endorses some issues with this prior to his stroke but worsened post stroke.  He denies previously speaking of this with his PCP or prostate evaluation before (unable to verify via epic as unable to view PCP notes or labs).  No further concerns at this time. ? ? ?Stroke admission on 03/05/2021 ?Benjamin Rowland is a 81 y.o. 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  Personally reviewed hospitalization pertinent progress notes, lab work and imaging with summary provided.  Stroke work-up acute infarcts left frontoparietal, L parietal lobe and left occipital s/p tPA secondary to left MCA stenosis vs embolization with partial recanalization, cryptogenic etiology -arthrosclerosis vs embolic.  Loop recorder placed.  EF 50 to 55%.  LDL 151.  A1c 5.7.  Initiated aspirin and atorvastatin 80 mg daily for secondary stroke prevention.  No prior history of HTN and initiated lisinopril 5 mg daily for elevated BPs.  No prior stroke history.  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 D/c'd to [facility] 6/23.  ? ? ? ? ? ?ROS:   ?14 system review of systems performed and negative with exception of those listed in HPI ? ?PMH:  ?Past Medical History:  ?Diagnosis Date  ? Aphasia   ? CVD (cardiovascular disease)   ? GERD (gastroesophageal reflux disease)   ? 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 Hyperlipidemia   ? Seizures ([place])   ? none since brain surgery  ? Stroke [facility])   ? ? ?PSH:  ?Past Surgical History:  ?Procedure Laterality Date  ? [place]  ? R frontal lobe  ? LOOP RECORDER INSERTION N/A 03/08/2021  ? Procedure: LOOP RECORDER INSERTION;  Surgeon: Vickie Epley, MD;  Location: [place] [facility];  Service: Cardiovascular;  Laterality: N/A;  ? ? ?Social History:  ?Social History  ? ?Socioeconomic History  ? Marital status: Single  ?  Spouse name: Not on file  ? Number of children: 0  ? Years of education: Not on file  ? Highest education level: Not on file  ?Occupational History  ? Not on file  ?Tobacco Use  ? Smoking status: Never  ? Smokeless tobacco: Never  ?Substance and Sexual Activity  ? Alcohol use: Not Currently  ? Drug use: Never  ? Sexual activity: Not on file  ?Other Topics Concern  ? Not on file  ?Social History Narrative  ? 8/4/222 living at [facility]  ? ?Social  Determinants of Health  ? ?Financial Resource Strain: Not on file  ?Food Insecurity: Not on file  ?Transportation Needs: Not on file  ?Physical Activity: Not on file  ?Stress: Not on file  ?Social Connections: Not on file  ?Intimate Partner Violence: Not on file  ? ? ?Family History: No family history on file. ? ?Medications:   ?Current Outpatient Medications on File Prior to Visit  ?Medication Sig Dispense Refill  ? Acetaminophen 650 MG/20.3ML SUSP Take 625 mg by mouth every 4 (four) hours as needed.    ? aspirin 81 MG chewable tablet Chew 1 tablet (81 mg total) by mouth daily.    ? atorvastatin (LIPITOR) 80 MG tablet Take 1 tablet (80 mg total) by mouth at bedtime. 30 tablet 0  ? Cholecalciferol (VITAMIN D-3 PO) Take 1 tablet by mouth daily.    ? finasteride (PROSCAR) 5 MG tablet Take 5 mg by mouth daily.    ? lisinopril (ZESTRIL) 20 MG tablet Take 20 mg by mouth  daily.    ? pantoprazole (PROTONIX) 40 MG tablet Take 1 tablet (40 mg total) by mouth at bedtime. 30 tablet 0  ? acetaminophen (TYLENOL) 325 MG tablet Take 2 tablets (650 mg total) by mouth every 4 (four) hours as needed for mild pain (or temp > 37.5 C (99.5 F)). (Patient not taking: Reported on 01/24/2022)    ? tamsulosin (FLOMAX) 0.4 MG CAPS capsule Take 0.4 mg by mouth in the morning and at bedtime. (Patient not taking: Reported on 01/24/2022)    ? ?No current facility-administered medications on file prior to visit.  ? ? ?Allergies:  No Known Allergies ? ? ? ?OBJECTIVE: ? ?Physical Exam ? ?Vitals:  ? 01/24/22 1110  ?BP: (!) 153/77  ?Pulse: 72  ?Weight: 164 lb (74.4 kg)  ?Height: 5\' 10"  (1.778 m)  ? ? ?Body mass index is 23.53 kg/m?Marland Kitchen ?No results found. ? ? ?General: well developed, well nourished, very pleasant elderly Caucasian male, seated, in no evident distress ?Head: head normocephalic and atraumatic.   ?Neck: supple with no carotid or supraclavicular bruits ?Cardiovascular: regular rate and rhythm, no murmurs ?Skin:  no rash/petichiae ?Vascular:   Normal pulses all extremities ?  ?Neurologic Exam ?Mental Status: Awake and fully alert. Mild dysarthria and speech hesitancy.  Able to follow commands without difficulty and name objects without difficulty.  O

## 2022-02-07 NOTE — Progress Notes (Signed)
Carelink Summary Report / Loop Recorder 

## 2022-05-16 IMAGING — CT CT HEAD W/O CM
4 series · 17 of 47 positions shown, 19 images · non-contrast
Comparison: Yesterday

CLINICAL DATA: Stroke follow-up.  Aphasia/slurred speech

EXAM:
CT HEAD WITHOUT CONTRAST
TECHNIQUE: Contiguous axial images were obtained from the base of the skull
through the vertex without intravenous contrast.

[Series 3: head without · axial · non-contrast · 0.42mm/px · z∈[-94,+46]mm · 7 of 38 slices shown, 9 images]
[im 5/38  brain]
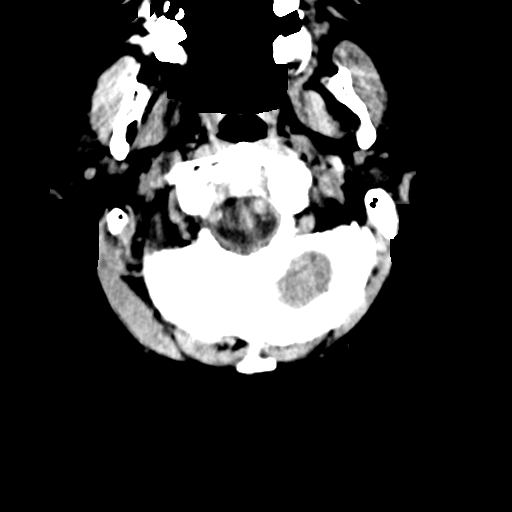
[im 5/38  bone]
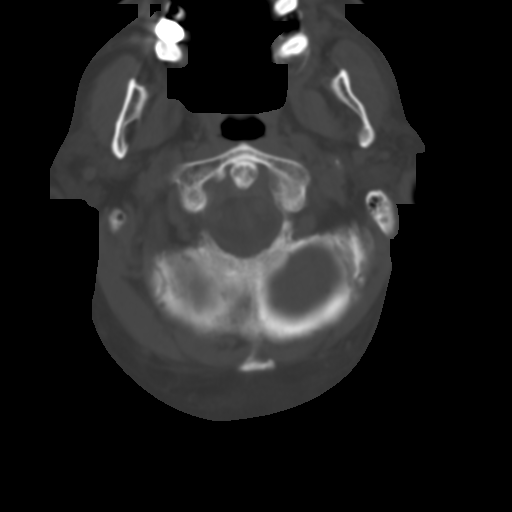
[im 10/38  brain]
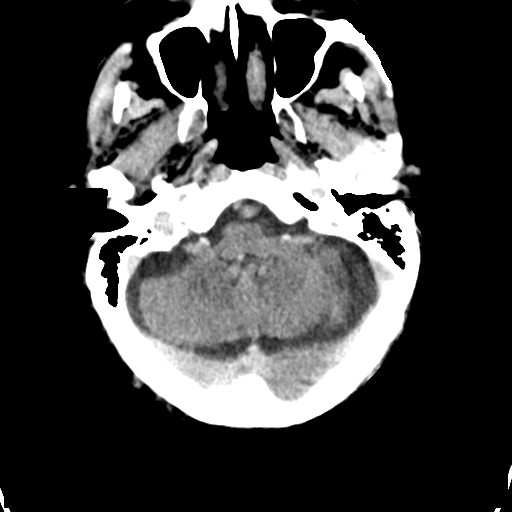
[im 14/38  brain]
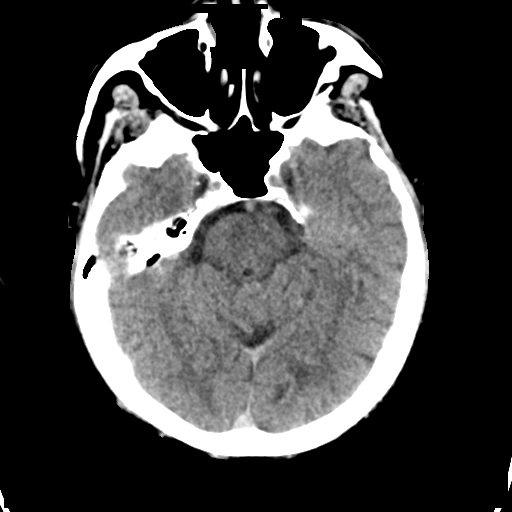
[im 19/38  brain]
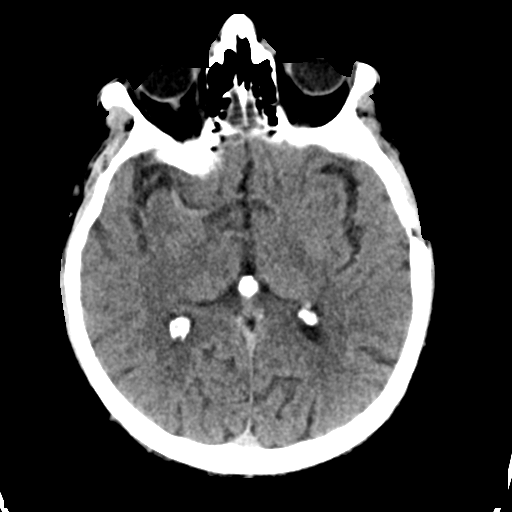
[im 24/38  brain]
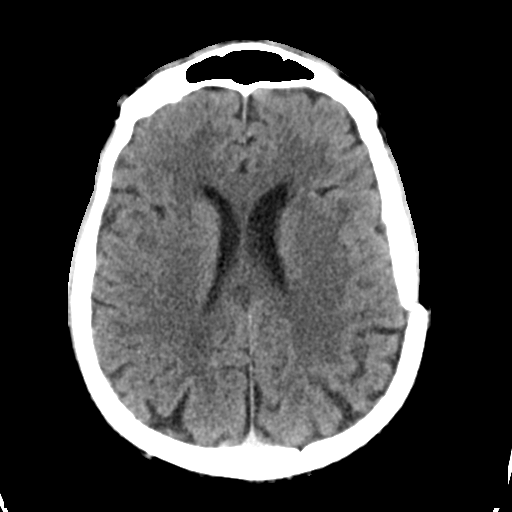
[im 24/38  bone]
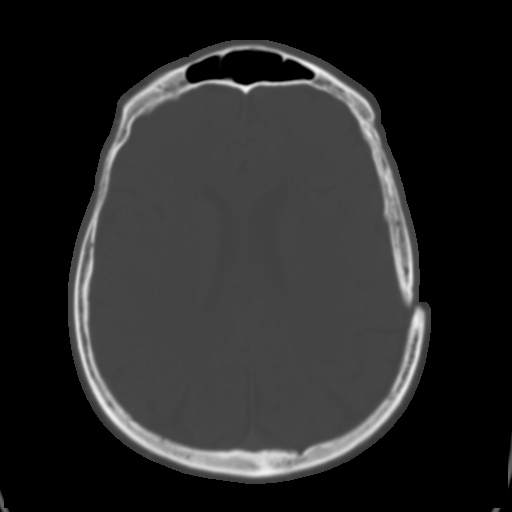
[im 28/38  brain]
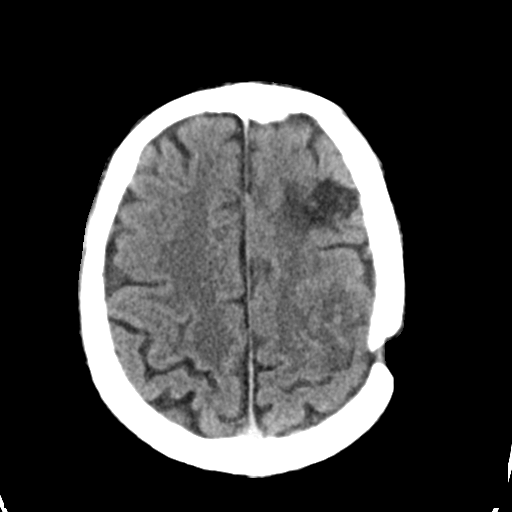
[im 33/38  brain]
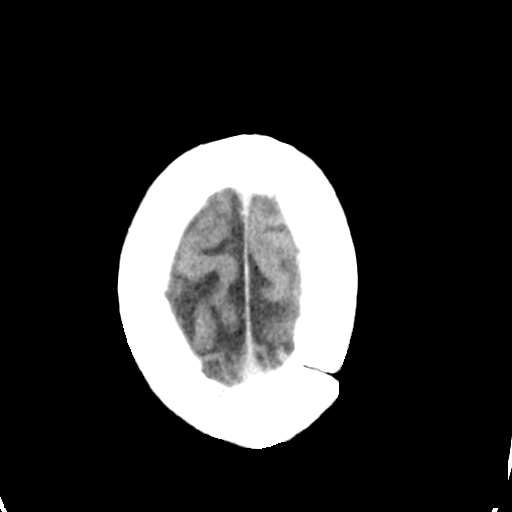

[Series 4: head bone · axial · 0.42mm/px · z∈[-96,-32]mm · 4 of 94 slices shown]
[im 10/94  bone]
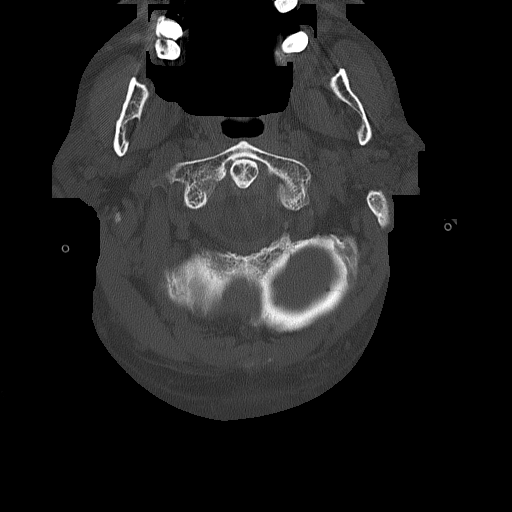
[im 19/94  bone]
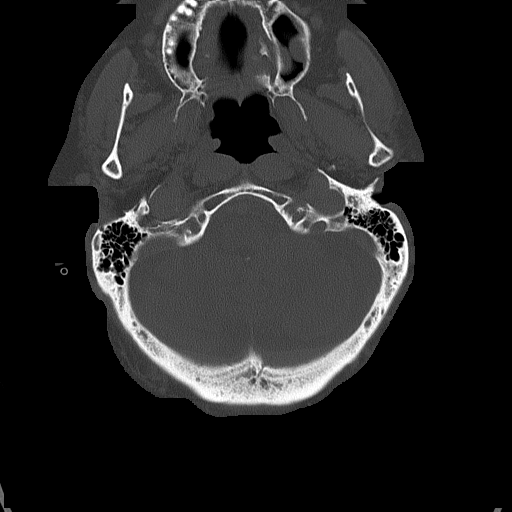
[im 28/94  bone]
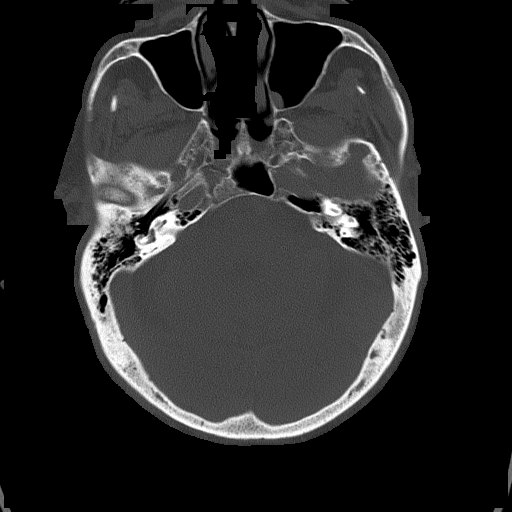
[im 42/94  bone]
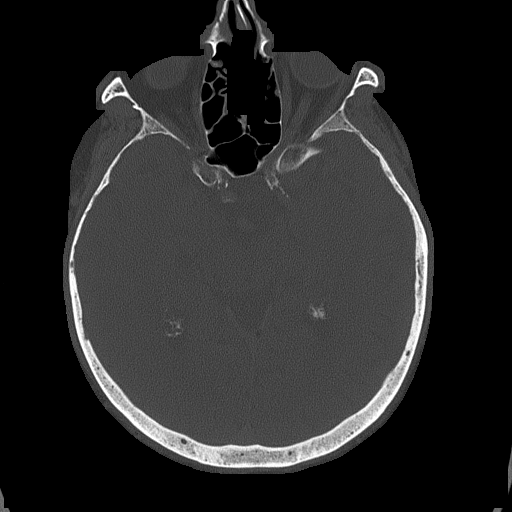

[Series 5: head without cor · coronal · non-contrast · 0.35mm/px · 3 of 74 slices shown]
[im 25/74  brain]
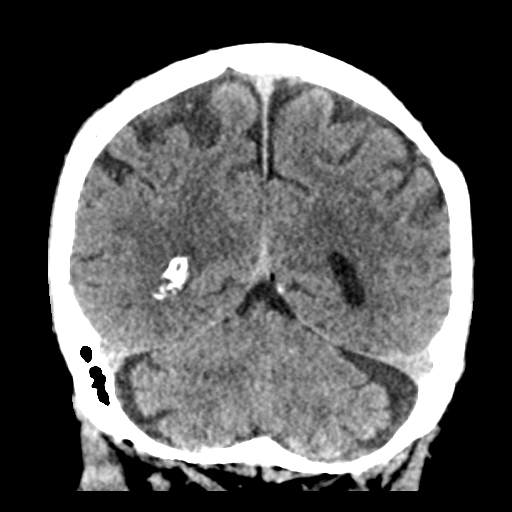
[im 33/74  brain]
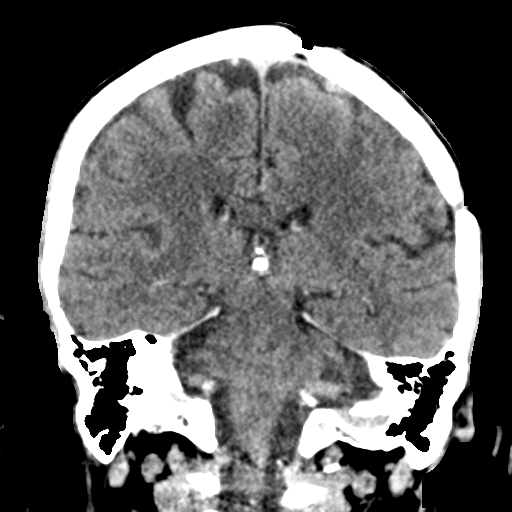
[im 41/74  brain]
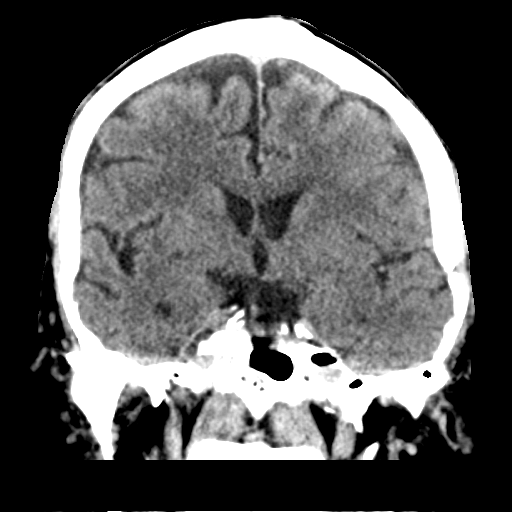

[Series 6: head without sag · sagittal · non-contrast · 0.36mm/px · 3 of 59 slices shown]
[im 20/59  brain]
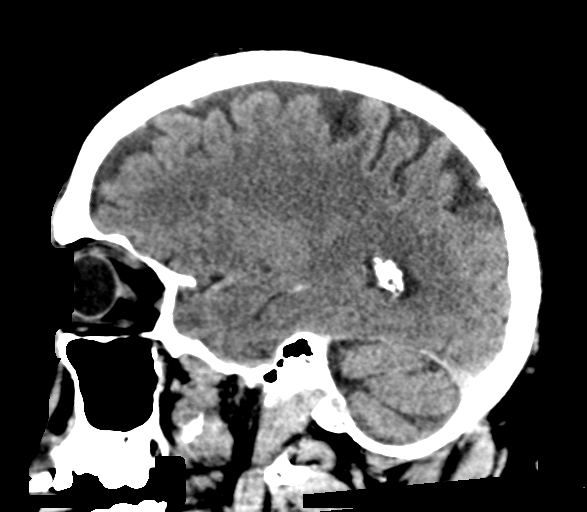
[im 30/59  brain]
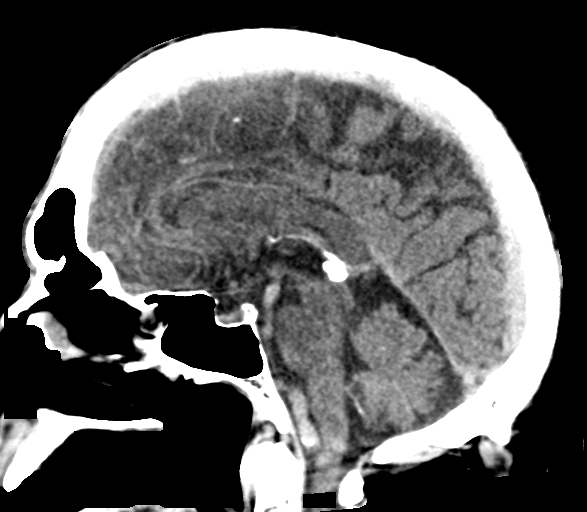
[im 39/59  brain]
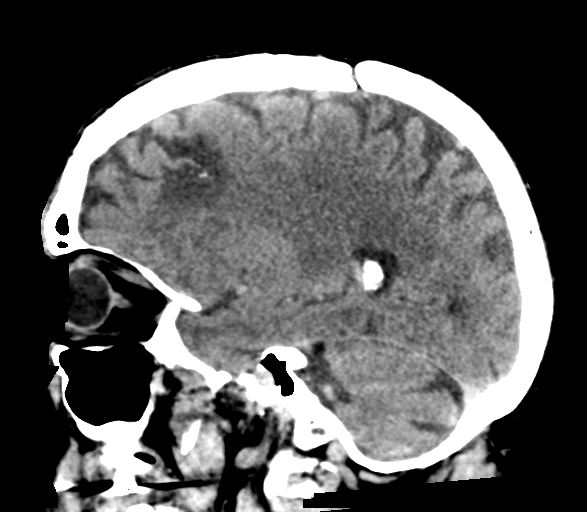

[17 of 47 positions shown; findings below may reference images not displayed]

FINDINGS: Brain: Cytotoxic edema has become apparent in the left frontal
parietal cortex. No acute hemorrhage, hydrocephalus, or collection.
Dense encephalomalacia in the left frontal lobe where there has been
prior craniotomy. Parenchymal calcification and superficial metallic
densities are present at the level of encephalomalacia.

Vascular: No hyperdense vessel or unexpected calcification.

Skull: Remote left craniotomy.

Sinuses/Orbits: Negative
IMPRESSION: Acute infarct has become apparent in the left frontal parietal
cortex.

No acute hemorrhage.

## 2022-06-13 ENCOUNTER — Telehealth: Payer: Self-pay | Admitting: Cardiology

## 2022-06-13 NOTE — Telephone Encounter (Signed)
Patient wants to confirm his home device check was stopped.  Patient stated he is still getting billed and would like a call back.

## 2022-06-13 NOTE — Telephone Encounter (Signed)
Pt states he no longer wants to be monitor remotely. I let him know if he is not monitored  remotely and he have an atrial fibrillation episode we will not know.  He states he do not want to be remotely monitored. He wants it discontinued. I told him he can come into the office and have his device check.

## 2022-06-17 NOTE — Telephone Encounter (Signed)
I called the patient to let him know that Dr. Quentin Ore recommended that he come into the office every 3 months. No answer. Left a message on the voicemail.

## 2022-06-17 NOTE — Telephone Encounter (Signed)
I spoke with the patient to let him know that Dr. Quentin Ore recommends that he come into the office q3 to be checked. I told him the scheduler will give him a call this week to be seen in office.   [facility]- device clinic or App is fine.

## 2022-06-17 NOTE — Telephone Encounter (Signed)
Please see note below. 

## 2022-06-27 NOTE — Telephone Encounter (Signed)
  Called pt and advised we need to see him in the office for device interrogation since he cancelled his transmission. Pt said he doesn't have any transportation and doesn't know who can take him at the office, pt refused to schedule an appt. He said,he will call if he can come to the office

## 2022-08-01 ENCOUNTER — Encounter: Payer: Self-pay | Admitting: Cardiology
